# Patient Record
Sex: Male | Born: 1960 | Race: White | Hispanic: No | Marital: Single | Smoking: Current every day smoker
Health system: Southern US, Community
[De-identification: ages and names within clinical notes are randomized; demographics above are authoritative.]

## PROBLEM LIST (undated history)

## (undated) DIAGNOSIS — M549 Dorsalgia, unspecified: Secondary | ICD-10-CM

## (undated) DIAGNOSIS — G8929 Other chronic pain: Secondary | ICD-10-CM

## (undated) HISTORY — PX: CHOLECYSTECTOMY: SHX55

---

## 1998-05-25 ENCOUNTER — Emergency Department (HOSPITAL_COMMUNITY): Admission: EM | Admit: 1998-05-25 | Discharge: 1998-05-25 | Payer: Self-pay | Admitting: Emergency Medicine

## 2003-01-13 ENCOUNTER — Emergency Department (HOSPITAL_COMMUNITY): Admission: EM | Admit: 2003-01-13 | Discharge: 2003-01-13 | Payer: Self-pay | Admitting: Emergency Medicine

## 2009-08-08 ENCOUNTER — Emergency Department (HOSPITAL_COMMUNITY): Admission: EM | Admit: 2009-08-08 | Discharge: 2009-08-09 | Payer: Self-pay | Admitting: Emergency Medicine

## 2010-01-11 ENCOUNTER — Ambulatory Visit: Payer: Self-pay | Admitting: Radiology

## 2010-01-11 ENCOUNTER — Emergency Department (HOSPITAL_BASED_OUTPATIENT_CLINIC_OR_DEPARTMENT_OTHER): Admission: EM | Admit: 2010-01-11 | Discharge: 2010-01-11 | Payer: Self-pay | Admitting: Emergency Medicine

## 2010-06-29 LAB — CBC
HCT: 46.6 % (ref 39.0–52.0)
MCH: 30.9 pg (ref 26.0–34.0)
RBC: 5.1 MIL/uL (ref 4.22–5.81)
RDW: 12.6 % (ref 11.5–15.5)
WBC: 14 10*3/uL — ABNORMAL HIGH (ref 4.0–10.5)

## 2010-06-29 LAB — COMPREHENSIVE METABOLIC PANEL
ALT: 32 U/L (ref 0–53)
Calcium: 9.4 mg/dL (ref 8.4–10.5)
Creatinine, Ser: 1.2 mg/dL (ref 0.4–1.5)
GFR calc non Af Amer: >60 mL/min (ref >60)
Glucose, Bld: 93 mg/dL (ref 70–99)
Potassium: 4.2 mEq/L (ref 3.5–5.1)
Total Protein: 7.8 g/dL (ref 6.0–8.3)

## 2010-06-29 LAB — DIFFERENTIAL
Basophils Absolute: 0.2 10*3/uL — ABNORMAL HIGH (ref 0.0–0.1)
Lymphs Abs: 1.8 10*3/uL (ref 0.7–4.0)
Monocytes Relative: 5 % (ref 3–12)
Neutrophils Relative %: 80 % — ABNORMAL HIGH (ref 43–77)

## 2010-07-04 LAB — POCT I-STAT, CHEM 8
BUN: 10 mg/dL (ref 6–23)
Calcium, Ion: 1.01 mmol/L — ABNORMAL LOW (ref 1.12–1.32)
Creatinine, Ser: 1.4 mg/dL (ref 0.4–1.5)
HCT: 48 % (ref 39.0–52.0)
Potassium: 3.5 mEq/L (ref 3.5–5.1)
Sodium: 143 mEq/L (ref 135–145)

## 2011-08-19 ENCOUNTER — Emergency Department (INDEPENDENT_AMBULATORY_CARE_PROVIDER_SITE_OTHER): Payer: Self-pay

## 2011-08-19 ENCOUNTER — Encounter (HOSPITAL_BASED_OUTPATIENT_CLINIC_OR_DEPARTMENT_OTHER): Payer: Self-pay | Admitting: *Deleted

## 2011-08-19 ENCOUNTER — Emergency Department (HOSPITAL_BASED_OUTPATIENT_CLINIC_OR_DEPARTMENT_OTHER)
Admission: EM | Admit: 2011-08-19 | Discharge: 2011-08-19 | Disposition: A | Discharge status: Home / Self Care | Payer: Self-pay | Attending: Emergency Medicine | Admitting: Emergency Medicine

## 2011-08-19 DIAGNOSIS — R05 Cough: Secondary | ICD-10-CM

## 2011-08-19 DIAGNOSIS — G8929 Other chronic pain: Secondary | ICD-10-CM | POA: Insufficient documentation

## 2011-08-19 DIAGNOSIS — M542 Cervicalgia: Secondary | ICD-10-CM | POA: Insufficient documentation

## 2011-08-19 DIAGNOSIS — R6889 Other general symptoms and signs: Secondary | ICD-10-CM

## 2011-08-19 DIAGNOSIS — F17200 Nicotine dependence, unspecified, uncomplicated: Secondary | ICD-10-CM

## 2011-08-19 DIAGNOSIS — M549 Dorsalgia, unspecified: Secondary | ICD-10-CM | POA: Insufficient documentation

## 2011-08-19 DIAGNOSIS — R059 Cough, unspecified: Secondary | ICD-10-CM | POA: Insufficient documentation

## 2011-08-19 DIAGNOSIS — R209 Unspecified disturbances of skin sensation: Secondary | ICD-10-CM

## 2011-08-19 MED ORDER — PANTOPRAZOLE SODIUM 40 MG PO TBEC
DELAYED_RELEASE_TABLET | ORAL | Status: AC
  Filled 2011-08-19: qty 1

## 2011-08-19 MED ORDER — HYDROMORPHONE HCL PF 1 MG/ML IJ SOLN
1.0000 mg | Freq: Once | INTRAMUSCULAR | Status: AC
  Administered 2011-08-19: 1 mg via INTRAMUSCULAR
  Filled 2011-08-19: qty 1

## 2011-08-19 MED ORDER — HYDROMORPHONE HCL PF 2 MG/ML IJ SOLN
INTRAMUSCULAR | Status: AC
  Filled 2011-08-19: qty 1

## 2011-08-19 MED ORDER — OMEPRAZOLE 20 MG PO CPDR: 20.0000 mg | DELAYED_RELEASE_CAPSULE | Freq: Every day | ORAL | Status: AC

## 2011-08-19 MED ORDER — IBUPROFEN 800 MG PO TABS: 800.0000 mg | ORAL_TABLET | Freq: Three times a day (TID) | ORAL | Status: AC

## 2011-08-19 MED ORDER — OXYCODONE-ACETAMINOPHEN 5-325 MG PO TABS: 2.0000 | ORAL_TABLET | ORAL | Status: DC | PRN

## 2011-08-19 MED ORDER — OXYCODONE-ACETAMINOPHEN 5-325 MG PO TABS: 1.0000 | ORAL_TABLET | ORAL | Status: AC | PRN

## 2011-08-19 NOTE — ED Notes (Signed)
Pt states he has had numbness and tingling in his fingers and feet for about a year or so. For the past month has noticed increased pain in his upper back and if he turns a certain way "feels like electricity shoots through" Non prod cough.

## 2011-08-19 NOTE — Discharge Instructions (Signed)
Take percocet as needed for severe pain.  Do not drive within four hours of taking this medication (may cause drowsiness or confusion).  Take ibuprofen w/ food up to three times a day, as well.  It may benefit to take one omeprazole a day as well to prevent the ibuprofen from hurting your stomach.  Call [facility] [phone]) if you do not have a primary care doctor and would like assistance with finding one.   Your family doctor may be able to order an MRI of your neck and assist you in following up with a neurosurgeon.  There are no signs of a trapped nerve or mass in your throat on your cat scan today.
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
## 2011-08-20 NOTE — ED Provider Notes (Signed)
Medical screening examination/treatment/procedure(s) were performed by non-physician practitioner and as supervising physician I was immediately available for consultation/collaboration.   Gwyneth Sprout, MD 08/20/11 2154

## 2012-03-24 ENCOUNTER — Encounter (HOSPITAL_BASED_OUTPATIENT_CLINIC_OR_DEPARTMENT_OTHER): Payer: Self-pay | Admitting: *Deleted

## 2012-03-24 ENCOUNTER — Emergency Department (HOSPITAL_BASED_OUTPATIENT_CLINIC_OR_DEPARTMENT_OTHER)
Admission: EM | Admit: 2012-03-24 | Discharge: 2012-03-24 | Disposition: A | Discharge status: Home / Self Care | Payer: Self-pay | Attending: Emergency Medicine | Admitting: Emergency Medicine

## 2012-03-24 DIAGNOSIS — K089 Disorder of teeth and supporting structures, unspecified: Secondary | ICD-10-CM | POA: Insufficient documentation

## 2012-03-24 DIAGNOSIS — K0889 Other specified disorders of teeth and supporting structures: Secondary | ICD-10-CM

## 2012-03-24 DIAGNOSIS — F17200 Nicotine dependence, unspecified, uncomplicated: Secondary | ICD-10-CM | POA: Insufficient documentation

## 2012-03-24 DIAGNOSIS — Z79899 Other long term (current) drug therapy: Secondary | ICD-10-CM | POA: Insufficient documentation

## 2012-03-24 DIAGNOSIS — R51 Headache: Secondary | ICD-10-CM | POA: Insufficient documentation

## 2012-03-24 MED ORDER — PENICILLIN V POTASSIUM 500 MG PO TABS: 500.0000 mg | ORAL_TABLET | Freq: Three times a day (TID) | ORAL | Status: DC

## 2012-03-24 MED ORDER — PENICILLIN V POTASSIUM 250 MG PO TABS
500.0000 mg | ORAL_TABLET | Freq: Four times a day (QID) | ORAL | Status: DC
  Administered 2012-03-24: 250 mg via ORAL

## 2012-03-24 MED ORDER — PENICILLIN V POTASSIUM 250 MG PO TABS
ORAL_TABLET | ORAL | Status: AC
  Administered 2012-03-24: 250 mg via ORAL
  Filled 2012-03-24: qty 2

## 2012-03-24 MED ORDER — OXYCODONE-ACETAMINOPHEN 5-325 MG PO TABS: 2.0000 | ORAL_TABLET | ORAL | Status: DC | PRN

## 2012-03-24 MED ORDER — OXYCODONE-ACETAMINOPHEN 5-325 MG PO TABS
2.0000 | ORAL_TABLET | Freq: Once | ORAL | Status: AC
  Administered 2012-03-24: 2 via ORAL
  Filled 2012-03-24 (×2): qty 2

## 2012-03-24 NOTE — ED Notes (Signed)
Dental pain. States he has not slept in 2 days due to severe pain.

## 2012-03-24 NOTE — ED Provider Notes (Signed)
Medical screening examination/treatment/procedure(s) were performed by non-physician practitioner and as supervising physician I was immediately available for consultation/collaboration.    Nelia Shi, MD 03/24/12 [phone]

## 2012-03-24 NOTE — ED Provider Notes (Signed)
History     CSN: [id number]  Arrival date & time 03/24/12  1345   First MD Initiated Contact with Patient 03/24/12 1445      Chief Complaint  Patient presents with  . Dental Pain    (Consider location/radiation/quality/duration/timing/severity/associated sxs/prior treatment) HPI Comments: The patient is a 51 year old otherwise healthy male who presents with dental pain that started gradually 2 days ago. The dental pain is severe, constant and progressively worsening. The pain is aching and located in generalized upper and lower jaw. The pain does radiate into his head. Eating makes the pain worse. Nothing makes the pain better. The patient has not tried anything for pain. Associated symptoms include headache. Patient denies neck pain/stiffness, fever, NVD, edema, sore throat, throat swelling, wheezing, SOB, chest pain, abdominal pain.     Patient is a 51 y.o. male presenting with tooth pain.  Dental Pain   History reviewed. No pertinent past medical history.  Past Surgical History  Procedure Date  . Cholecystectomy     No family history on file.  History  Substance Use Topics  . Smoking status: Current Every Day Smoker -- 2.0 packs/day    Types: Cigarettes  . Smokeless tobacco: Not on file  . Alcohol Use: Yes      Review of Systems  HENT: Positive for dental problem.   All other systems reviewed and are negative.    Allergies  Vicodin  Home Medications   Current Outpatient Rx  Name  Route  Sig  Dispense  Refill  . OMEPRAZOLE 20 MG PO CPDR   Oral   Take 1 capsule (20 mg total) by mouth daily.   20 capsule   0     BP 156/74  Pulse 82  Temp 98.6 F (37 C) (Oral)  Resp 20  SpO2 99%  Physical Exam  Nursing note and vitals reviewed. Constitutional: He is oriented to person, place, and time. He appears well-developed and well-nourished. No distress.  HENT:  Head: Normocephalic and atraumatic.  Mouth/Throat: Oropharynx is clear and moist. No  oropharyngeal exudate.       Poor dentition with multiple missing teeth and some remaining teeth are loose.   Eyes: Conjunctivae normal and EOM are normal.  Neck: Normal range of motion. Neck supple.  Cardiovascular: Normal rate and regular rhythm.  Exam reveals no gallop and no friction rub.   No murmur heard. Pulmonary/Chest: Effort normal and breath sounds normal. He has no wheezes. He has no rales. He exhibits no tenderness.  Abdominal: Soft. He exhibits no distension.  Musculoskeletal: Normal range of motion.  Neurological: He is alert and oriented to person, place, and time.       Speech is goal-oriented. Moves limbs without ataxia.   Skin: Skin is warm and dry. He is not diaphoretic.  Psychiatric: He has a normal mood and affect. His behavior is normal.    ED Course  Procedures (including critical care time)  Labs Reviewed - No data to display No results found.   1. Pain, dental       MDM  2:59 PM Patient given Percocet and Pen VK for dental pain and infection prophylaxis. Patient has agreed to make an appointment with dentist within 24 hours. I specifically explained that he must call the dental clinic within 24 hours to be guaranteed to be seen. Patient afebrile and non toxic.         Emilia Beck, PA-C 03/24/12 1511

## 2012-03-24 NOTE — ED Notes (Signed)
Dental pain x 2 days, states pulled two teeth last week due to pain. Used to see dentist in [place], but is unable due to finances. Pt missing several teeth and has a loose tooth to right upper mouth. Pain constant since 1am, took one pill of amoxicillin that a friend gave him.

## 2012-11-11 ENCOUNTER — Encounter (HOSPITAL_BASED_OUTPATIENT_CLINIC_OR_DEPARTMENT_OTHER): Payer: Self-pay | Admitting: *Deleted

## 2012-11-11 ENCOUNTER — Emergency Department (HOSPITAL_BASED_OUTPATIENT_CLINIC_OR_DEPARTMENT_OTHER)
Admission: EM | Admit: 2012-11-11 | Discharge: 2012-11-11 | Disposition: A | Discharge status: Home / Self Care | Payer: Self-pay | Attending: Emergency Medicine | Admitting: Emergency Medicine

## 2012-11-11 DIAGNOSIS — D367 Benign neoplasm of other specified sites: Secondary | ICD-10-CM | POA: Insufficient documentation

## 2012-11-11 DIAGNOSIS — F17200 Nicotine dependence, unspecified, uncomplicated: Secondary | ICD-10-CM | POA: Insufficient documentation

## 2012-11-11 DIAGNOSIS — M542 Cervicalgia: Secondary | ICD-10-CM | POA: Insufficient documentation

## 2012-11-11 DIAGNOSIS — G8929 Other chronic pain: Secondary | ICD-10-CM | POA: Insufficient documentation

## 2012-11-11 MED ORDER — LIDOCAINE HCL 2 % IJ SOLN
INTRAMUSCULAR | Status: AC
  Filled 2012-11-11: qty 20

## 2012-11-11 MED ORDER — OXYCODONE-ACETAMINOPHEN 5-325 MG PO TABS: 2.0000 | ORAL_TABLET | ORAL | Status: DC | PRN

## 2012-11-11 NOTE — ED Notes (Signed)
ems transport from home- c/o abscess on left shoulder. Admits to drinking "a lot of beer"

## 2012-11-11 NOTE — ED Provider Notes (Signed)
Medical screening examination/treatment/procedure(s) were performed by non-physician practitioner and as supervising physician I was immediately available for consultation/collaboration.   Terell Kincy B. Bernette Mayers, MD 11/11/12 2149

## 2012-11-11 NOTE — ED Provider Notes (Signed)
CSN: [id number]     Arrival date & time 11/11/12  2027 History     None    Chief Complaint  Patient presents with  . Mass  . Numbness   (Consider location/radiation/quality/duration/timing/severity/associated sxs/prior Treatment) Patient is a 52 y.o. male presenting with abscess. The history is provided by the patient. No language interpreter was used.  Abscess Location:  Shoulder/arm Shoulder/arm abscess location:  L shoulder Abscess quality: painful, redness and warmth   Red streaking: no   Duration:  2 days Progression:  Worsening Pain details:    Quality:  Aching   Severity:  No pain   Timing:  Constant   Progression:  Worsening Relieved by:  Nothing Worsened by:  Nothing tried Ineffective treatments:  None tried   History reviewed. No pertinent past medical history. Past Surgical History  Procedure Laterality Date  . Cholecystectomy     No family history on file. History  Substance Use Topics  . Smoking status: Current Every Day Smoker -- 2.00 packs/day    Types: Cigarettes  . Smokeless tobacco: Not on file  . Alcohol Use: Yes    Review of Systems  Skin: Positive for wound.  All other systems reviewed and are negative.    Allergies  Vicodin  Home Medications   Current Outpatient Rx  Name  Route  Sig  Dispense  Refill  . EXPIRED: omeprazole (PRILOSEC) 20 MG capsule   Oral   Take 1 capsule (20 mg total) by mouth daily.   20 capsule   0   . oxyCODONE-acetaminophen (PERCOCET/ROXICET) 5-325 MG per tablet   Oral   Take 2 tablets by mouth every 4 (four) hours as needed for pain.   6 tablet   0   . penicillin v potassium (VEETID) 500 MG tablet   Oral   Take 1 tablet (500 mg total) by mouth 3 (three) times daily.   30 tablet   0    BP 124/73  Pulse 84  Temp(Src) 98 F (36.7 C) (Oral)  Resp 16  Ht 5\' 11"  (1.803 m)  Wt 175 lb (79.379 kg)  BMI 24.42 kg/m2  SpO2 96% Physical Exam  Nursing note and vitals reviewed. Constitutional: He is  oriented to person, place, and time. He appears well-developed and well-nourished.  HENT:  Head: Normocephalic and atraumatic.  Musculoskeletal: Normal range of motion.  1cm round area/abscess cyst on left shoulder  Neurological: He is alert and oriented to person, place, and time. He has normal reflexes.  Skin: Skin is warm.  Psychiatric: He has a normal mood and affect.    ED Course   INCISION AND DRAINAGE Date/Time: 11/11/2012 9:42 PM Performed by: Elson Areas Authorized by: Elson Areas Consent: Verbal consent not obtained. Risks and benefits: risks, benefits and alternatives were discussed Consent given by: patient Patient identity confirmed: verbally with patient Time out: Immediately prior to procedure a "time out" was called to verify the correct patient, procedure, equipment, support staff and site/side marked as required. Type: cyst Body area: upper extremity Local anesthetic: lidocaine 2% without epinephrine Scalpel size: 11 Incision type: single straight Complexity: simple Drainage: purulent Wound treatment: wound left open Patient tolerance: Patient tolerated the procedure well with no immediate complications. Comments: Large amount of sebaceous material and capsule removed   (including critical care time)  Labs Reviewed - No data to display No results found. 1. Cyst, dermoid, arm, left   2. Chronic neck pain     MDM  Pt also has  chronic neck pain.   Pt given number for Dr. Lajoyce Corners for evaluation.   Pt given rx for Percocet  10 tablets,    Lonia Skinner [place], [place] 11/11/12 2148

## 2016-01-13 ENCOUNTER — Emergency Department (HOSPITAL_BASED_OUTPATIENT_CLINIC_OR_DEPARTMENT_OTHER): Payer: Self-pay

## 2016-01-13 ENCOUNTER — Emergency Department (HOSPITAL_BASED_OUTPATIENT_CLINIC_OR_DEPARTMENT_OTHER)
Admission: EM | Admit: 2016-01-13 | Discharge: 2016-01-13 | Disposition: A | Discharge status: Home / Self Care | Payer: Self-pay | Attending: Emergency Medicine | Admitting: Emergency Medicine

## 2016-01-13 ENCOUNTER — Encounter (HOSPITAL_BASED_OUTPATIENT_CLINIC_OR_DEPARTMENT_OTHER): Payer: Self-pay | Admitting: Emergency Medicine

## 2016-01-13 DIAGNOSIS — M5410 Radiculopathy, site unspecified: Secondary | ICD-10-CM

## 2016-01-13 DIAGNOSIS — F17210 Nicotine dependence, cigarettes, uncomplicated: Secondary | ICD-10-CM | POA: Insufficient documentation

## 2016-01-13 DIAGNOSIS — M542 Cervicalgia: Secondary | ICD-10-CM

## 2016-01-13 DIAGNOSIS — M5412 Radiculopathy, cervical region: Secondary | ICD-10-CM | POA: Insufficient documentation

## 2016-01-13 MED ORDER — PREDNISONE 10 MG PO TABS: 20.0000 mg | ORAL_TABLET | Freq: Two times a day (BID) | ORAL | 0 refills | Status: DC

## 2016-01-13 MED ORDER — KETOROLAC TROMETHAMINE 60 MG/2ML IM SOLN
60.0000 mg | Freq: Once | INTRAMUSCULAR | Status: AC
  Administered 2016-01-13: 60 mg via INTRAMUSCULAR
  Filled 2016-01-13: qty 2

## 2016-01-13 MED ORDER — OXYCODONE-ACETAMINOPHEN 5-325 MG PO TABS: 1.0000 | ORAL_TABLET | Freq: Four times a day (QID) | ORAL | 0 refills | Status: DC | PRN

## 2016-01-13 MED ORDER — SODIUM CHLORIDE 0.9 % IV BOLUS (SEPSIS)
30.0000 mL/kg | Freq: Once | INTRAVENOUS | Status: AC
  Administered 2016-01-13: 2313 mL via INTRAVENOUS

## 2016-01-13 MED FILL — predniSONE 10 MG TABS: 10 | 5 days supply | Qty: 20 | Fill #0

## 2016-01-13 MED FILL — OXYCODONE/APAP 5/325MG: 5-325 | 2 days supply | Qty: 20 | Fill #0

## 2016-01-13 NOTE — ED Notes (Signed)
Per MD discontinue IV fluids.

## 2016-01-13 NOTE — ED Notes (Signed)
Patient to CT.

## 2016-01-13 NOTE — ED Triage Notes (Signed)
Patient reports chronic neck pain x 2 years.  Reports that this has gotten worse over the past few days.  States that the pain radiates down into his collar bone and shoulder on the left side.

## 2016-01-13 NOTE — Discharge Instructions (Addendum)
Prednisone as prescribed.  Percocet as prescribed as needed for pain.  Follow-up with a primary Dr. if not improving in the next week to discuss further imaging or referral to a surgeon.

## 2016-01-13 NOTE — ED Provider Notes (Signed)
[facility] Provider Note   CSN: [id number] Arrival date & time: 01/13/16  [id number]     History   Chief Complaint Chief Complaint  Patient presents with  . Neck Pain    HPI Jimmy Small is a 55 y.o. male.  Patient is a 55 year old male with no significant past medical history. He presents for evaluation of neck pain. He reports having problems with his neck for the past 2 years, however has significantly worsened over the past several days. 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 He states that his fourth and fifth fingers have been numb for many months. He has seen his doctor in the past, however no studies have been performed.      History reviewed. No pertinent past medical history.  There are no active problems to display for this patient.   Past Surgical History:  Procedure Laterality Date  . CHOLECYSTECTOMY         Home Medications    Prior to Admission medications   Medication Sig Start Date End Date Taking? Authorizing Provider  omeprazole (PRILOSEC) 20 MG capsule Take 1 capsule (20 mg total) by mouth daily. 08/19/11 08/18/12  Gertha Calkin, PA-C  oxyCODONE-acetaminophen (PERCOCET/ROXICET) 5-325 MG per tablet Take 2 tablets by mouth every 4 (four) hours as needed for pain. 03/24/12   Alvina Chou, PA-C  oxyCODONE-acetaminophen (PERCOCET/ROXICET) 5-325 MG per tablet Take 2 tablets by mouth every 4 (four) hours as needed for pain. 11/11/12   Fransico Meadow, PA-C  penicillin v potassium (VEETID) 500 MG tablet Take 1 tablet (500 mg total) by mouth 3 (three) times daily. 03/24/12   Alvina Chou, PA-C    Family History History reviewed. No pertinent family history.  Social History Social History  Substance Use Topics  . Smoking status: Current Every Day Smoker    Packs/day: 2.00    Types: Cigarettes  . Smokeless tobacco: Never Used  . Alcohol use Yes     Allergies   Vicodin  [hydrocodone-acetaminophen]   Review of Systems Review of Systems  All other systems reviewed and are negative.    Physical Exam Updated Vital Signs BP 158/81 (BP Location: Left Arm)   Pulse 62   Temp 98.1 F (36.7 C) (Oral)   Resp 18   Ht 6' (1.829 m)   Wt 170 lb (77.1 kg)   SpO2 98%   BMI 23.06 kg/m   Physical Exam  Constitutional: He is oriented to person, place, and time. He appears well-developed and well-nourished. No distress.  HENT:  Head: Normocephalic and atraumatic.  Pulmonary/Chest: Effort normal.  Musculoskeletal: Normal range of motion.  There is tenderness to palpation of the soft tissues of the left side of the neck. He has some discomfort with turning his head.  Neurological: He is alert and oriented to person, place, and time.  Is able to flex, extend, and oppose all fingers. Ulnar and radial pulses are easily palpable. Sensation is intact to the entire hand.  Skin: Skin is warm and dry. He is not diaphoretic.  Nursing note and vitals reviewed.    ED Treatments / Results  Labs (all labs ordered are listed, but only abnormal results are displayed) Labs Reviewed - No data to display  EKG   Radiology No results found.  Procedures Procedures (including critical care time)  Medications Ordered in ED Medications  ketorolac (TORADOL) injection 60 mg (not administered)  sodium chloride 0.9 % bolus  2,313 mL (not administered)     Initial Impression / Assessment and Plan / ED Course  I have reviewed the triage vital signs and the nursing notes.  Pertinent labs & imaging results that were available during my care of the patient were reviewed by me and considered in my medical decision making (see chart for details).  Clinical Course    Patient presents with complaints of severe left-sided neck pain that radiates into his shoulder. He also reports numbness to his left fingertips. The symptoms at the present for several years, however recently  have worsened. His x-rays show the suggestion of a clay shoveler's fracture and radiology recommended a CT scan. 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 He will be treated with prednisone, pain medication, and follow-up with his primary Dr. if not improving in the next week.  Final Clinical Impressions(s) / ED Diagnoses   Final diagnoses:  None    New Prescriptions New Prescriptions   No medications on file     Veryl Speak, MD 01/13/16 1108

## 2017-11-10 ENCOUNTER — Emergency Department (HOSPITAL_BASED_OUTPATIENT_CLINIC_OR_DEPARTMENT_OTHER)
Admission: EM | Admit: 2017-11-10 | Discharge: 2017-11-10 | Disposition: A | Discharge status: Home / Self Care | Payer: BLUE CROSS/BLUE SHIELD | Attending: Emergency Medicine | Admitting: Emergency Medicine

## 2017-11-10 ENCOUNTER — Other Ambulatory Visit: Payer: Self-pay

## 2017-11-10 ENCOUNTER — Emergency Department (HOSPITAL_BASED_OUTPATIENT_CLINIC_OR_DEPARTMENT_OTHER): Payer: BLUE CROSS/BLUE SHIELD

## 2017-11-10 ENCOUNTER — Encounter (HOSPITAL_BASED_OUTPATIENT_CLINIC_OR_DEPARTMENT_OTHER): Payer: Self-pay | Admitting: Emergency Medicine

## 2017-11-10 DIAGNOSIS — M545 Low back pain, unspecified: Secondary | ICD-10-CM

## 2017-11-10 DIAGNOSIS — F17210 Nicotine dependence, cigarettes, uncomplicated: Secondary | ICD-10-CM | POA: Insufficient documentation

## 2017-11-10 DIAGNOSIS — Z79899 Other long term (current) drug therapy: Secondary | ICD-10-CM | POA: Diagnosis not present

## 2017-11-10 DIAGNOSIS — G8929 Other chronic pain: Secondary | ICD-10-CM | POA: Diagnosis not present

## 2017-11-10 HISTORY — DX: Dorsalgia, unspecified: M54.9

## 2017-11-10 HISTORY — DX: Other chronic pain: G89.29

## 2017-11-10 MED ORDER — OXYCODONE-ACETAMINOPHEN 5-325 MG PO TABS
1.0000 | ORAL_TABLET | Freq: Once | ORAL | Status: AC
  Administered 2017-11-10: 1 via ORAL
  Filled 2017-11-10: qty 1

## 2017-11-10 MED ORDER — MELOXICAM 15 MG PO TABS: 15.0000 mg | ORAL_TABLET | Freq: Every day | ORAL | 0 refills | Status: AC

## 2017-11-10 MED ORDER — PREDNISONE 20 MG PO TABS: 40.0000 mg | ORAL_TABLET | Freq: Every day | ORAL | 0 refills | Status: AC

## 2017-11-10 MED ORDER — METHOCARBAMOL 500 MG PO TABS: 500.0000 mg | ORAL_TABLET | Freq: Two times a day (BID) | ORAL | 0 refills | Status: DC

## 2017-11-10 NOTE — ED Notes (Signed)
Patient transported to X-ray 

## 2017-11-10 NOTE — ED Triage Notes (Signed)
L low back pain that started today. Denies injury.

## 2017-11-10 NOTE — Discharge Instructions (Signed)
Please establish care with PCP and follow up with pain management.

## 2017-11-10 NOTE — ED Provider Notes (Signed)
[facility] EMERGENCY DEPARTMENT Provider Note   CSN: [id number] Arrival date & time: 11/10/17  1526     History   Chief Complaint Chief Complaint  Patient presents with  . Back Pain    HPI Jimmy Small is a 57 y.o. male.  57 y.o male smoker with a PMH of chronic back pain presents to the ED with a chief complaint of lower back pain x several weeks. Patient states he's been having this pain for several weeks but got worst 2 hours when he was standing and he felt his back give out. He describes the pain as in stabbing nature mainly on his left lower back with radiation to his left buttock, no radiation to his knee. Patient reports the pain is worse with movement and when changing positions. He tried some ibuprofen for relieve but states no improvement in symptoms. He denies any fever,urinary symptoms, bladder or bowel incontinence, no chest pain or shortness of breath.      Past Medical History:  Diagnosis Date  . Chronic back pain     There are no active problems to display for this patient.   Past Surgical History:  Procedure Laterality Date  . CHOLECYSTECTOMY          Home Medications    Prior to Admission medications   Medication Sig Start Date End Date Taking? Authorizing Provider  meloxicam (MOBIC) 15 MG tablet Take 1 tablet (15 mg total) by mouth daily for 7 days. 11/10/17 11/17/17  Janeece Fitting, PA-C  methocarbamol (ROBAXIN) 500 MG tablet Take 1 tablet (500 mg total) by mouth 2 (two) times daily. 11/10/17   Janeece Fitting, PA-C  omeprazole (PRILOSEC) 20 MG capsule Take 1 capsule (20 mg total) by mouth daily. 08/19/11 08/18/12  Schinlever, Barnetta Chapel, PA-C  predniSONE (DELTASONE) 20 MG tablet Take 2 tablets (40 mg total) by mouth daily for 5 days. 11/10/17 11/15/17  Janeece Fitting, PA-C    Family History No family history on file.  Social History Social History   Tobacco Use  . Smoking status: Current Every Day Smoker    Packs/day: 2.00    Types: Cigarettes    . Smokeless tobacco: Never Used  Substance Use Topics  . Alcohol use: Yes  . Drug use: Yes    Frequency: 1.0 times per week    Types: Marijuana    Comment: occassional     Allergies   Vicodin [hydrocodone-acetaminophen]   Review of Systems Review of Systems  Constitutional: Negative for chills and fever.  HENT: Negative for ear pain and sore throat.   Eyes: Negative for pain and visual disturbance.  Respiratory: Negative for cough and shortness of breath.   Cardiovascular: Negative for chest pain and palpitations.  Gastrointestinal: Negative for abdominal pain and vomiting.  Genitourinary: Negative for dysuria and hematuria.  Musculoskeletal: Positive for back pain and myalgias. Negative for arthralgias.  Skin: Negative for color change and rash.  Neurological: Negative for seizures, syncope, weakness, light-headedness and headaches.  All other systems reviewed and are negative.    Physical Exam Updated Vital Signs BP (!) 141/79 (BP Location: Left Arm)   Pulse 82   Temp 97.9 F (36.6 C) (Oral)   Resp 18   Ht 5\' 11"  (1.803 m)   Wt 72.6 kg (160 lb)   SpO2 98%   BMI 22.32 kg/m   Physical Exam  Constitutional: He is oriented to person, place, and time. He appears well-developed and well-nourished.  HENT:  Head: Normocephalic and atraumatic.  Mouth/Throat: Oropharynx is clear and moist.  Eyes: Pupils are equal, round, and reactive to light. No scleral icterus.  Neck: Normal range of motion. Neck supple.  Cardiovascular: Normal heart sounds.  Pulmonary/Chest: Effort normal and breath sounds normal. He has no wheezes. He exhibits no tenderness.  Abdominal: Soft. Bowel sounds are normal. He exhibits no distension. There is no tenderness.  Musculoskeletal: He exhibits tenderness. He exhibits no deformity.       Cervical back: Normal. He exhibits no bony tenderness, no swelling, no edema and no pain.       Thoracic back: Normal. He exhibits no swelling, no edema, no  pain and no spasm.       Lumbar back: He exhibits tenderness, pain and spasm. He exhibits no bony tenderness and no swelling.       Back:  Pain reproducible with palpation of his lower back, radiation to his buttock  Neurological: He is alert and oriented to person, place, and time.  Skin: Skin is warm and dry. Capillary refill takes less than 2 seconds. No erythema.  Nursing note and vitals reviewed.    ED Treatments / Results  Labs (all labs ordered are listed, but only abnormal results are displayed) Labs Reviewed - No data to display  EKG None  Radiology Dg Lumbar Spine 2-3 Views  Result Date: 11/10/2017 CLINICAL DATA:  Low back pain radiating to left buttock. No known injury. EXAM: LUMBAR SPINE - 2-3 VIEW COMPARISON:  None. FINDINGS: No fracture or subluxation. Early degenerative spurring in the upper and mid lumbar spine. SI joints are symmetric and unremarkable. IMPRESSION: No acute bony abnormality. Electronically Signed   By: Rolm Baptise M.D.   On: 11/10/2017 16:55    Procedures Procedures (including critical care time)  Medications Ordered in ED Medications  oxyCODONE-acetaminophen (PERCOCET/ROXICET) 5-325 MG per tablet 1 tablet (1 tablet Oral Given 11/10/17 1616)     Initial Impression / Assessment and Plan / ED Course  I have reviewed the triage vital signs and the nursing notes.  Pertinent labs & imaging results that were available during my care of the patient were reviewed by me and considered in my medical decision making (see chart for details).    Patient has a history of back pain and was told to establish care with pain management physician. He states he never did. This pain is a chronic complaint for him. Patient feels better after receiving percocet in the ED. His lumbar xray showed no acute fractures.Patient is having some reproducible pain with palpation along his lower lumbar region.He denies any bowel or bladder incontinence at this time I think cauda  equina is less likely. Patient is not complaining of urinary symptoms, I believe nephrolithiasis is less likely. Patient asked for percocet prescription I have advised him at this time that establishing care with pain management is warranted. He is understand and agrees with plan. I will send home with steroids, mobic and muscle relaxers to help treat this chronic pain.   Final Clinical Impressions(s) / ED Diagnoses   Final diagnoses:  Chronic left-sided low back pain without sciatica    ED Discharge Orders        Ordered    predniSONE (DELTASONE) 20 MG tablet  Daily     11/10/17 1723    meloxicam (MOBIC) 15 MG tablet  Daily     11/10/17 1723    methocarbamol (ROBAXIN) 500 MG tablet  2 times daily     11/10/17 1723  Janeece Fitting, PA-C 11/10/17 1809    Virgel Manifold, MD 11/10/17 1930

## 2019-01-20 ENCOUNTER — Emergency Department (HOSPITAL_COMMUNITY)
Admission: EM | Admit: 2019-01-20 | Discharge: 2019-01-20 | Disposition: A | Discharge status: Home / Self Care | Attending: Emergency Medicine | Admitting: Emergency Medicine

## 2019-01-20 ENCOUNTER — Emergency Department (HOSPITAL_COMMUNITY)

## 2019-01-20 ENCOUNTER — Encounter (HOSPITAL_COMMUNITY): Payer: Self-pay

## 2019-01-20 DIAGNOSIS — F17210 Nicotine dependence, cigarettes, uncomplicated: Secondary | ICD-10-CM | POA: Diagnosis not present

## 2019-01-20 DIAGNOSIS — R109 Unspecified abdominal pain: Secondary | ICD-10-CM

## 2019-01-20 DIAGNOSIS — R1011 Right upper quadrant pain: Secondary | ICD-10-CM | POA: Diagnosis not present

## 2019-01-20 LAB — LIPASE, BLOOD: Lipase: 75 U/L — ABNORMAL HIGH (ref 11–51)

## 2019-01-20 LAB — CBC WITH DIFFERENTIAL/PLATELET
Abs Immature Granulocytes: 0.02 10*3/uL (ref 0.00–0.07)
Basophils Absolute: 0.1 10*3/uL (ref 0.0–0.1)
Basophils Relative: 1 %
Eosinophils Absolute: 0.1 10*3/uL (ref 0.0–0.5)
Eosinophils Relative: 2 %
HCT: 39.6 % (ref 39.0–52.0)
Hemoglobin: 13.1 g/dL (ref 13.0–17.0)
Immature Granulocytes: 0 %
Lymphocytes Relative: 22 %
Lymphs Abs: 1.6 10*3/uL (ref 0.7–4.0)
MCH: 32.4 pg (ref 26.0–34.0)
MCHC: 33.1 g/dL (ref 30.0–36.0)
MCV: 98 fL (ref 80.0–100.0)
Monocytes Absolute: 0.9 10*3/uL (ref 0.1–1.0)
Monocytes Relative: 12 %
Neutro Abs: 4.6 10*3/uL (ref 1.7–7.7)
Neutrophils Relative %: 63 %
Platelets: 214 10*3/uL (ref 150–400)
RBC: 4.04 MIL/uL — ABNORMAL LOW (ref 4.22–5.81)
RDW: 11.9 % (ref 11.5–15.5)
WBC: 7.2 10*3/uL (ref 4.0–10.5)
nRBC: 0 % (ref 0.0–0.2)

## 2019-01-20 LAB — COMPREHENSIVE METABOLIC PANEL
ALT: 25 U/L (ref 0–44)
AST: 27 U/L (ref 15–41)
Albumin: 4.1 g/dL (ref 3.5–5.0)
Alkaline Phosphatase: 44 U/L (ref 38–126)
Anion gap: 8 (ref 5–15)
BUN: 17 mg/dL (ref 6–20)
CO2: 26 mmol/L (ref 22–32)
Calcium: 9 mg/dL (ref 8.9–10.3)
Chloride: 108 mmol/L (ref 98–111)
Creatinine, Ser: 0.94 mg/dL (ref 0.61–1.24)
GFR calc Af Amer: >60 mL/min (ref >60)
GFR calc non Af Amer: >60 mL/min (ref >60)
Glucose, Bld: 105 mg/dL — ABNORMAL HIGH (ref 70–99)
Potassium: 3.9 mmol/L (ref 3.5–5.1)
Sodium: 142 mmol/L (ref 135–145)
Total Bilirubin: 0.8 mg/dL (ref 0.3–1.2)
Total Protein: 6.9 g/dL (ref 6.5–8.1)

## 2019-01-20 LAB — URINALYSIS, ROUTINE W REFLEX MICROSCOPIC
Bilirubin Urine: NEGATIVE
Glucose, UA: NEGATIVE mg/dL
Hgb urine dipstick: NEGATIVE
Ketones, ur: NEGATIVE mg/dL
Leukocytes,Ua: NEGATIVE
Nitrite: NEGATIVE
Protein, ur: NEGATIVE mg/dL
Specific Gravity, Urine: 1.015 (ref 1.005–1.030)
pH: 6 (ref 5.0–8.0)

## 2019-01-20 MED ORDER — LIDOCAINE 5 % EX PTCH: 1.0000 | MEDICATED_PATCH | CUTANEOUS | 0 refills | Status: AC

## 2019-01-20 MED ORDER — SODIUM CHLORIDE 0.9 % IV BOLUS
1000.0000 mL | Freq: Once | INTRAVENOUS | Status: AC
  Administered 2019-01-20: 1000 mL via INTRAVENOUS

## 2019-01-20 MED ORDER — ONDANSETRON HCL 4 MG/2ML IJ SOLN
4.0000 mg | Freq: Once | INTRAMUSCULAR | Status: AC
  Administered 2019-01-20: 4 mg via INTRAVENOUS
  Filled 2019-01-20: qty 2

## 2019-01-20 MED ORDER — FENTANYL CITRATE (PF) 100 MCG/2ML IJ SOLN
50.0000 ug | Freq: Once | INTRAMUSCULAR | Status: AC
  Administered 2019-01-20: 50 ug via INTRAVENOUS
  Filled 2019-01-20: qty 2

## 2019-01-20 NOTE — ED Triage Notes (Signed)
Pt arrives from [place] with [facility] for right flank/back/leg pain. Pt has been incarcerated since 01/16/19- states that the pain is 10/10.

## 2019-01-20 NOTE — ED Provider Notes (Signed)
[facility] DEPT Provider Note   CSN: [id number] Arrival date & time: 01/20/19  1756     History   Chief Complaint Chief Complaint  Patient presents with  . Flank Pain    HPI Jimmy Small is a 58 y.o. male.     The history is provided by the patient.  Flank Pain This is a new problem. The current episode started more than 2 days ago. The problem occurs daily. Progression since onset: waxes and wanes  Associated symptoms include abdominal pain. Pertinent negatives include no chest pain, no headaches and no shortness of breath. Nothing aggravates the symptoms. Nothing relieves the symptoms. He has tried nothing for the symptoms. The treatment provided no relief.    Past Medical History:  Diagnosis Date  . Chronic back pain     There are no active problems to display for this patient.   Past Surgical History:  Procedure Laterality Date  . CHOLECYSTECTOMY          Home Medications    Prior to Admission medications   Medication Sig Start Date End Date Taking? Authorizing Provider  lidocaine (LIDODERM) 5 % Place 1 patch onto the skin daily. Remove & Discard patch within 12 hours or as directed by MD 01/20/19 02/19/19  Lennice Sites, DO  methocarbamol (ROBAXIN) 500 MG tablet Take 1 tablet (500 mg total) by mouth 2 (two) times daily. Patient not taking: Reported on 01/20/2019 11/10/17   Janeece Fitting, PA-C  omeprazole (PRILOSEC) 20 MG capsule Take 1 capsule (20 mg total) by mouth daily. Patient not taking: Reported on 01/20/2019 08/19/11 08/18/12  Schinlever, Barnetta Chapel, PA-C    Family History No family history on file.  Social History Social History   Tobacco Use  . Smoking status: Current Every Day Smoker    Packs/day: 2.00    Types: Cigarettes  . Smokeless tobacco: Never Used  Substance Use Topics  . Alcohol use: Yes  . Drug use: Yes    Frequency: 1.0 times per week    Types: Marijuana    Comment: occassional     Allergies    Vicodin [hydrocodone-acetaminophen]   Review of Systems Review of Systems  Constitutional: Negative for chills and fever.  HENT: Negative for ear pain and sore throat.   Eyes: Negative for pain and visual disturbance.  Respiratory: Negative for cough and shortness of breath.   Cardiovascular: Negative for chest pain and palpitations.  Gastrointestinal: Positive for abdominal pain. Negative for vomiting.  Genitourinary: Positive for flank pain. Negative for decreased urine volume, difficulty urinating, discharge, dysuria, enuresis, frequency, genital sores, hematuria, penile pain, penile swelling, scrotal swelling, testicular pain and urgency.  Musculoskeletal: Negative for arthralgias and back pain.  Skin: Negative for color change and rash.  Neurological: Negative for seizures, syncope and headaches.  All other systems reviewed and are negative.    Physical Exam Updated Vital Signs  ED Triage Vitals  Enc Vitals Group     BP 01/20/19 1901 125/85     Pulse Rate 01/20/19 1820 (!) 58     Resp 01/20/19 1901 17     Temp 01/20/19 1817 98 F (36.7 C)     Temp Source 01/20/19 1817 Oral     SpO2 01/20/19 1812 98 %     Weight --      Height --      Head Circumference --      Peak Flow --      Pain Score 01/20/19 1815 10  Pain Loc --      Pain Edu? --      Excl. in [place]? --     Physical Exam Vitals signs and nursing note reviewed.  Constitutional:      Appearance: He is well-developed.  HENT:     Head: Normocephalic and atraumatic.     Nose: Nose normal.     Mouth/Throat:     Mouth: Mucous membranes are moist.  Eyes:     Extraocular Movements: Extraocular movements intact.     Conjunctiva/sclera: Conjunctivae normal.     Pupils: Pupils are equal, round, and reactive to light.  Neck:     Musculoskeletal: Normal range of motion and neck supple.  Cardiovascular:     Rate and Rhythm: Normal rate and regular rhythm.     Pulses: Normal pulses.     Heart sounds: Normal  heart sounds. No murmur.  Pulmonary:     Effort: Pulmonary effort is normal. No respiratory distress.     Breath sounds: Normal breath sounds.  Abdominal:     Palpations: Abdomen is soft.     Tenderness: There is abdominal tenderness (right side abdomen ). There is right CVA tenderness.  Genitourinary:    Penis: Normal.      Scrotum/Testes: Normal.  Musculoskeletal: Normal range of motion.        General: No tenderness.  Skin:    General: Skin is warm and dry.     Capillary Refill: Capillary refill takes less than 2 seconds.  Neurological:     General: No focal deficit present.     Mental Status: He is alert.      ED Treatments / Results  Labs (all labs ordered are listed, but only abnormal results are displayed) Labs Reviewed  CBC WITH DIFFERENTIAL/PLATELET - Abnormal; Notable for the following components:      Result Value   RBC 4.04 (*)    All other components within normal limits  COMPREHENSIVE METABOLIC PANEL - Abnormal; Notable for the following components:   Glucose, Bld 105 (*)    All other components within normal limits  LIPASE, BLOOD - Abnormal; Notable for the following components:   Lipase 75 (*)    All other components within normal limits  URINALYSIS, ROUTINE W REFLEX MICROSCOPIC    EKG None  Radiology Ct Renal Stone Study  Result Date: 01/20/2019 CLINICAL DATA:  Right flank and back pain EXAM: CT ABDOMEN AND PELVIS WITHOUT CONTRAST TECHNIQUE: Multidetector CT imaging of the abdomen and pelvis was performed following the standard protocol without IV contrast. COMPARISON:  None. FINDINGS: Lower chest: The visualized heart size within normal limits. No pericardial fluid/thickening. No hiatal hernia. The visualized portions of the lungs are clear. Hepatobiliary: Although limited due to the lack of intravenous contrast, normal in appearance without gross focal abnormality. The patient is status post cholecystectomy. No biliary ductal dilation. Pancreas:   Unremarkable.  No surrounding inflammatory changes. Spleen: Normal in size. Although limited due to the lack of intravenous contrast, normal in appearance. Adrenals/Urinary Tract: Both adrenal glands appear normal. The kidneys and collecting system appear normal without evidence of urinary tract calculus or hydronephrosis. Bladder is unremarkable. Stomach/Bowel: The stomach, small bowel, and colon are normal in appearance. A moderate amount of colonic stool is present. No inflammatory changes or obstructive findings. appendix is normal. Vascular/Lymphatic: There are no enlarged abdominal or pelvic lymph nodes. Scattered aortic atherosclerotic calcifications are seen without aneurysmal dilatation. Reproductive: The prostate is unremarkable. Other: No evidence of abdominal wall mass  or hernia. Musculoskeletal: No acute or significant osseous findings. IMPRESSION: No renal, collecting system, or bladder calculi. No other acute intra-abdominal pathology to explain the patient's symptoms. Electronically Signed   By: Prudencio Pair M.D.   On: 01/20/2019 20:22    Procedures Procedures (including critical care time)  Medications Ordered in ED Medications  fentaNYL (SUBLIMAZE) injection 50 mcg (50 mcg Intravenous Given 01/20/19 1920)  sodium chloride 0.9 % bolus 1,000 mL (0 mLs Intravenous Stopped 01/20/19 2245)  ondansetron (ZOFRAN) injection 4 mg (4 mg Intravenous Given 01/20/19 1921)     Initial Impression / Assessment and Plan / ED Course  I have reviewed the triage vital signs and the nursing notes.  Pertinent labs & imaging results that were available during my care of the patient were reviewed by me and considered in my medical decision making (see chart for details).     Jimmy Small is a 58 year old male with chronic back pain who presents to the ED with right flank pain.  Patient with normal vitals.  No fever.  Pain on and off for the last several days.  Worse with movement.  Has some tenderness to  the right side of his abdomen on exam.  No midline spinal pain.  Lab work showed no significant leukocytosis, anemia, electrolyte abnormality.  Urinalysis negative for infection.  GU exam is unremarkable.  CT scan of the abdomen and pelvis did not show any appendicitis, no bowel obstruction, no colitis, no kidney stone.  Overall likely musculoskeletal pain.  Patient had improvement of pain with IV fluids, fentanyl.  Given reassurance and discharged from ED in good condition.  Recommend Tylenol, Motrin, lidocaine patch for pain.  Given return precautions.  This chart was dictated using voice recognition software.  Despite best efforts to proofread,  errors can occur which can change the documentation meaning.    Final Clinical Impressions(s) / ED Diagnoses   Final diagnoses:  Flank pain    ED Discharge Orders         Ordered    lidocaine (LIDODERM) 5 %  Every 24 hours     01/20/19 2248           Lennice Sites, DO 01/20/19 2251

## 2019-01-20 NOTE — ED Notes (Signed)
Pt was verbalized discharge instructions. Pt had no further questions at this time. NAD. 

## 2019-01-20 NOTE — ED Notes (Signed)
Pt attempting to provide urine specimen. Urinal at bedside

## 2020-10-24 ENCOUNTER — Other Ambulatory Visit (HOSPITAL_BASED_OUTPATIENT_CLINIC_OR_DEPARTMENT_OTHER): Payer: Self-pay

## 2020-10-24 ENCOUNTER — Encounter (HOSPITAL_BASED_OUTPATIENT_CLINIC_OR_DEPARTMENT_OTHER): Payer: Self-pay | Admitting: Emergency Medicine

## 2020-10-24 ENCOUNTER — Emergency Department (HOSPITAL_BASED_OUTPATIENT_CLINIC_OR_DEPARTMENT_OTHER): Payer: Self-pay

## 2020-10-24 ENCOUNTER — Other Ambulatory Visit: Payer: Self-pay

## 2020-10-24 ENCOUNTER — Emergency Department (HOSPITAL_BASED_OUTPATIENT_CLINIC_OR_DEPARTMENT_OTHER)
Admission: EM | Admit: 2020-10-24 | Discharge: 2020-10-24 | Disposition: A | Discharge status: Home / Self Care | Payer: Self-pay | Attending: Emergency Medicine | Admitting: Emergency Medicine

## 2020-10-24 DIAGNOSIS — M25512 Pain in left shoulder: Secondary | ICD-10-CM | POA: Insufficient documentation

## 2020-10-24 DIAGNOSIS — R0789 Other chest pain: Secondary | ICD-10-CM | POA: Insufficient documentation

## 2020-10-24 DIAGNOSIS — F17210 Nicotine dependence, cigarettes, uncomplicated: Secondary | ICD-10-CM | POA: Insufficient documentation

## 2020-10-24 DIAGNOSIS — R202 Paresthesia of skin: Secondary | ICD-10-CM | POA: Insufficient documentation

## 2020-10-24 DIAGNOSIS — Y92009 Unspecified place in unspecified non-institutional (private) residence as the place of occurrence of the external cause: Secondary | ICD-10-CM | POA: Insufficient documentation

## 2020-10-24 DIAGNOSIS — W010XXA Fall on same level from slipping, tripping and stumbling without subsequent striking against object, initial encounter: Secondary | ICD-10-CM | POA: Insufficient documentation

## 2020-10-24 DIAGNOSIS — W19XXXA Unspecified fall, initial encounter: Secondary | ICD-10-CM

## 2020-10-24 MED ORDER — METHOCARBAMOL 500 MG PO TABS
500.0000 mg | ORAL_TABLET | Freq: Two times a day (BID) | ORAL | 0 refills | Status: AC | PRN
  Filled 2020-10-24: qty 20, 10d supply, fill #0

## 2020-10-24 MED ORDER — OXYCODONE-ACETAMINOPHEN 5-325 MG PO TABS
1.0000 | ORAL_TABLET | Freq: Once | ORAL | Status: AC
  Administered 2020-10-24: 1 via ORAL
  Filled 2020-10-24: qty 1

## 2020-10-24 MED ORDER — IBUPROFEN 800 MG PO TABS
800.0000 mg | ORAL_TABLET | Freq: Three times a day (TID) | ORAL | 0 refills | Status: AC | PRN
  Filled 2020-10-24: qty 21, 7d supply, fill #0

## 2020-10-24 NOTE — ED Notes (Signed)
Patient ambulated to X-ray 

## 2020-10-24 NOTE — ED Notes (Signed)
ED Provider at bedside. 

## 2020-10-24 NOTE — ED Triage Notes (Signed)
Pt arrives pov with c/o fall yesterday, reports L shoulder pain, difficulty moving arm. Pt has abrasion to L shoulder, L chest wall. Pt denies otc meds pta.

## 2020-10-24 NOTE — ED Provider Notes (Signed)
[facility] EMERGENCY DEPARTMENT Provider Note   CSN: [id number] Arrival date & time: 10/24/20  0900     History Chief Complaint  Patient presents with   Jimmy Small is a 60 y.o. male.  He is here for evaluation of injuries after a fall.  He said he slipped going down a ramp yesterday at home landing on his left shoulder.  Complaining of severe left shoulder pain left upper chest pain with tingling into his left hand.  No loss of consciousness.  No head or neck pain.  Not on blood thinners.  Has tried nothing for it.  The history is provided by the patient.  Fall This is a new problem. The current episode started yesterday. The problem has not changed since onset.Associated symptoms include chest pain. Pertinent negatives include no abdominal pain, no headaches and no shortness of breath. The symptoms are aggravated by bending and twisting. Nothing relieves the symptoms. He has tried rest for the symptoms. The treatment provided no relief.      Past Medical History:  Diagnosis Date   Chronic back pain     There are no problems to display for this patient.   Past Surgical History:  Procedure Laterality Date   CHOLECYSTECTOMY         History reviewed. No pertinent family history.  Social History   Tobacco Use   Smoking status: Every Day    Packs/day: 2.00    Pack years: 0.00    Types: Cigarettes   Smokeless tobacco: Never  Substance Use Topics   Alcohol use: Yes    Comment: daily   Drug use: Not Currently    Frequency: 1.0 times per week    Types: Marijuana    Comment: occassional    Home Medications Prior to Admission medications   Medication Sig Start Date End Date Taking? Authorizing Provider  methocarbamol (ROBAXIN) 500 MG tablet Take 1 tablet (500 mg total) by mouth 2 (two) times daily. Patient not taking: Reported on 01/20/2019 11/10/17   Janeece Fitting, PA-C  omeprazole (PRILOSEC) 20 MG capsule Take 1 capsule (20 mg total) by mouth  daily. Patient not taking: Reported on 01/20/2019 08/19/11 08/18/12  Schinlever, Barnetta Chapel, PA-C    Allergies    Vicodin [hydrocodone-acetaminophen]  Review of Systems   Review of Systems  Constitutional:  Negative for fever.  HENT:  Negative for sore throat.   Eyes:  Negative for visual disturbance.  Respiratory:  Negative for shortness of breath.   Cardiovascular:  Positive for chest pain.  Gastrointestinal:  Negative for abdominal pain.  Genitourinary:  Negative for dysuria.  Musculoskeletal:  Negative for neck pain.  Skin:  Negative for rash.  Neurological:  Negative for headaches.   Physical Exam Updated Vital Signs BP (!) 144/98 (BP Location: Right Arm)   Pulse 96   Temp 97.8 F (36.6 C) (Oral)   Resp 20   Ht 5\' 11"  (1.803 m)   Wt 74.8 kg   SpO2 100%   BMI 23.01 kg/m   Physical Exam Vitals and nursing note reviewed.  Constitutional:      Appearance: Normal appearance. He is well-developed.  HENT:     Head: Normocephalic and atraumatic.  Eyes:     Conjunctiva/sclera: Conjunctivae normal.  Cardiovascular:     Rate and Rhythm: Normal rate and regular rhythm.     Heart sounds: No murmur heard. Pulmonary:     Effort: Pulmonary effort is normal. No respiratory distress.  Breath sounds: Normal breath sounds.  Abdominal:     Palpations: Abdomen is soft.     Tenderness: There is no abdominal tenderness.  Musculoskeletal:        General: Tenderness and signs of injury present. No deformity.     Cervical back: Neck supple.     Comments: He has diffuse tenderness around his left shoulder.  Normal landmarks.  Normal internal/external rotation.  Distal neurovascular intact.  Complaining of some subjective tingling in his left hand.  Radial pulse 2+.  Cap refill brisk.  Left chest wall also mildly tender.  No crepitus.  No cervical tenderness.  Other extremities full range of motion without any pain or limitations.  Skin:    General: Skin is warm and dry.  Neurological:      General: No focal deficit present.     Mental Status: He is alert.     GCS: GCS eye subscore is 4. GCS verbal subscore is 5. GCS motor subscore is 6.     Gait: Gait normal.    ED Results / Procedures / Treatments   Labs (all labs ordered are listed, but only abnormal results are displayed) Labs Reviewed - No data to display  EKG None  Radiology DG Ribs Unilateral W/Chest Left  Result Date: 10/24/2020 CLINICAL DATA:  Status post fall. EXAM: LEFT RIBS AND CHEST - 3+ VIEW COMPARISON:  08/19/2011 FINDINGS: No fracture or other bone lesions are seen involving the ribs. There is no evidence of pneumothorax or pleural effusion. Both lungs are clear. Heart size and mediastinal contours are within normal limits. IMPRESSION: Negative. Electronically Signed   By: Kerby Moors M.D.   On: 10/24/2020 09:57   DG Shoulder Left  Result Date: 10/24/2020 CLINICAL DATA:  Fall.  Shoulder pain EXAM: LEFT SHOULDER - 2+ VIEW COMPARISON:  None. FINDINGS: Glenohumeral joint is intact. No evidence of scapular fracture or humeral fracture. The acromioclavicular joint is intact. IMPRESSION: For No fracture or dislocation. Electronically Signed   By: Suzy Bouchard M.D.   On: 10/24/2020 09:57    Procedures Procedures   Medications Ordered in ED Medications  oxyCODONE-acetaminophen (PERCOCET/ROXICET) 5-325 MG per tablet 1 tablet (has no administration in time range)    ED Course  I have reviewed the triage vital signs and the nursing notes.  Pertinent labs & imaging results that were available during my care of the patient were reviewed by me and considered in my medical decision making (see chart for details).  Clinical Course as of 10/24/20 1734  Mon Oct 24, 2020  3825 X-rays ordered and interpreted by me as no acute fracture no pneumothorax.  No dislocation.  Reviewed with patient.  Will get him a sling. [MB]    Clinical Course User Index [MB] Hayden Rasmussen, MD   MDM  Rules/Calculators/A&P                         60 year old male not on thinners here for evaluation of injuries after a fall.  He is complaining of severe left shoulder pain and left upper chest axillary pain.  X-rays were ordered and interpreted by me of left shoulder and left ribs chest as no acute fractures no pneumothorax.  He will be treated symptomatically with anti-inflammatories accident.  Sling.  Recommended close follow-up with PCP.  Return instructions discussed  Final Clinical Impression(s) / ED Diagnoses Final diagnoses:  Acute pain of left shoulder  Fall, initial encounter    Rx /  DC Orders ED Discharge Orders          Ordered    ibuprofen (ADVIL) 800 MG tablet  Every 8 hours PRN        10/24/20 1007    methocarbamol (ROBAXIN) 500 MG tablet  2 times daily PRN        10/24/20 1007             Hayden Rasmussen, MD 10/24/20 1736

## 2020-10-24 NOTE — ED Notes (Signed)
Pt endorses having driver for discharge

## 2020-10-24 NOTE — Discharge Instructions (Addendum)
You are seen in the emergency department for evaluation of left shoulder pain after a fall.  You had x-rays of your left shoulder and chest and left ribs that did not show any fracture or dislocation.  Please use ice to the affected area.  Sling for comfort.  We are prescribing you some ibuprofen and a muscle relaxant.  Follow-up with your primary care doctor.  Return to the emergency department if any worsening or concerning symptoms

## 2020-10-24 NOTE — ED Notes (Signed)
Pt discharged to home. Discharge instructions have been discussed with patient and/or family members. Pt verbally acknowledges understanding d/c instructions, and endorses comprehension to checkout at registration before leaving.  °

## 2021-07-19 ENCOUNTER — Other Ambulatory Visit: Payer: Self-pay

## 2021-07-19 ENCOUNTER — Emergency Department (HOSPITAL_COMMUNITY)
Admission: EM | Admit: 2021-07-19 | Discharge: 2021-07-19 | Disposition: A | Discharge status: Home / Self Care | Attending: Physician Assistant | Admitting: Physician Assistant

## 2021-07-19 ENCOUNTER — Encounter (HOSPITAL_COMMUNITY): Payer: Self-pay

## 2021-07-19 DIAGNOSIS — Z5321 Procedure and treatment not carried out due to patient leaving prior to being seen by health care provider: Secondary | ICD-10-CM | POA: Insufficient documentation

## 2021-07-19 DIAGNOSIS — R1030 Lower abdominal pain, unspecified: Secondary | ICD-10-CM | POA: Insufficient documentation

## 2021-07-19 DIAGNOSIS — F10129 Alcohol abuse with intoxication, unspecified: Secondary | ICD-10-CM | POA: Insufficient documentation

## 2021-07-19 LAB — URINALYSIS, ROUTINE W REFLEX MICROSCOPIC
Bilirubin Urine: NEGATIVE
Glucose, UA: NEGATIVE mg/dL
Hgb urine dipstick: NEGATIVE
Ketones, ur: NEGATIVE mg/dL
Leukocytes,Ua: NEGATIVE
Nitrite: NEGATIVE
Protein, ur: NEGATIVE mg/dL
Specific Gravity, Urine: 1.002 — ABNORMAL LOW (ref 1.005–1.030)
pH: 5 (ref 5.0–8.0)

## 2021-07-19 NOTE — ED Provider Triage Note (Signed)
Emergency Medicine Provider Triage Evaluation Note ? ?Jimmy Small , a 61 y.o. male  was evaluated in triage.  Pt complains of urinary symptoms that have been ongoing for several months.  States "when I take a leak it hurts ".  Patient appears intoxicated at baseline. ? ?Review of Systems  ?Positive: Urinary symptoms ?Negative: Abdominal pain, fever, nausea, vomiting ? ?Physical Exam  ?BP (!) 146/76 (BP Location: Right Arm)   Pulse 91   Temp 98 ?F (36.7 ?C) (Oral)   Resp 17   SpO2 96%  ?Gen:   Awake, no distress   ?Resp:  Normal effort  ?MSK:   Moves extremities without difficulty  ?Other:  Appears clinically intoxicated slurring his words. ? ?Medical Decision Making  ?Medically screening exam initiated at 2:02 PM.  Appropriate orders placed.  Jimmy Small was informed that the remainder of the evaluation will be completed by another provider, this initial triage assessment does not replace that evaluation, and the importance of remaining in the ED until their evaluation is complete. ? ? ?  ?Janeece Fitting, PA-C ?07/19/21 1405 ? ?

## 2021-07-19 NOTE — ED Notes (Signed)
Called pt, no response.  ?

## 2021-07-19 NOTE — ED Notes (Signed)
Called pt 3x, no response.  ?

## 2021-07-19 NOTE — ED Triage Notes (Signed)
Pt arrived POV stating he is having groin pain. Pt will not say much else to RN.  ?

## 2022-01-25 IMAGING — CR DG RIBS W/ CHEST 3+V*L*
3 series · 3 of 3 positions shown · non-contrast
Comparison: 08/19/2011

CLINICAL DATA: Status post fall.

EXAM:
LEFT RIBS AND CHEST - 3+ VIEW

[w chest pa]
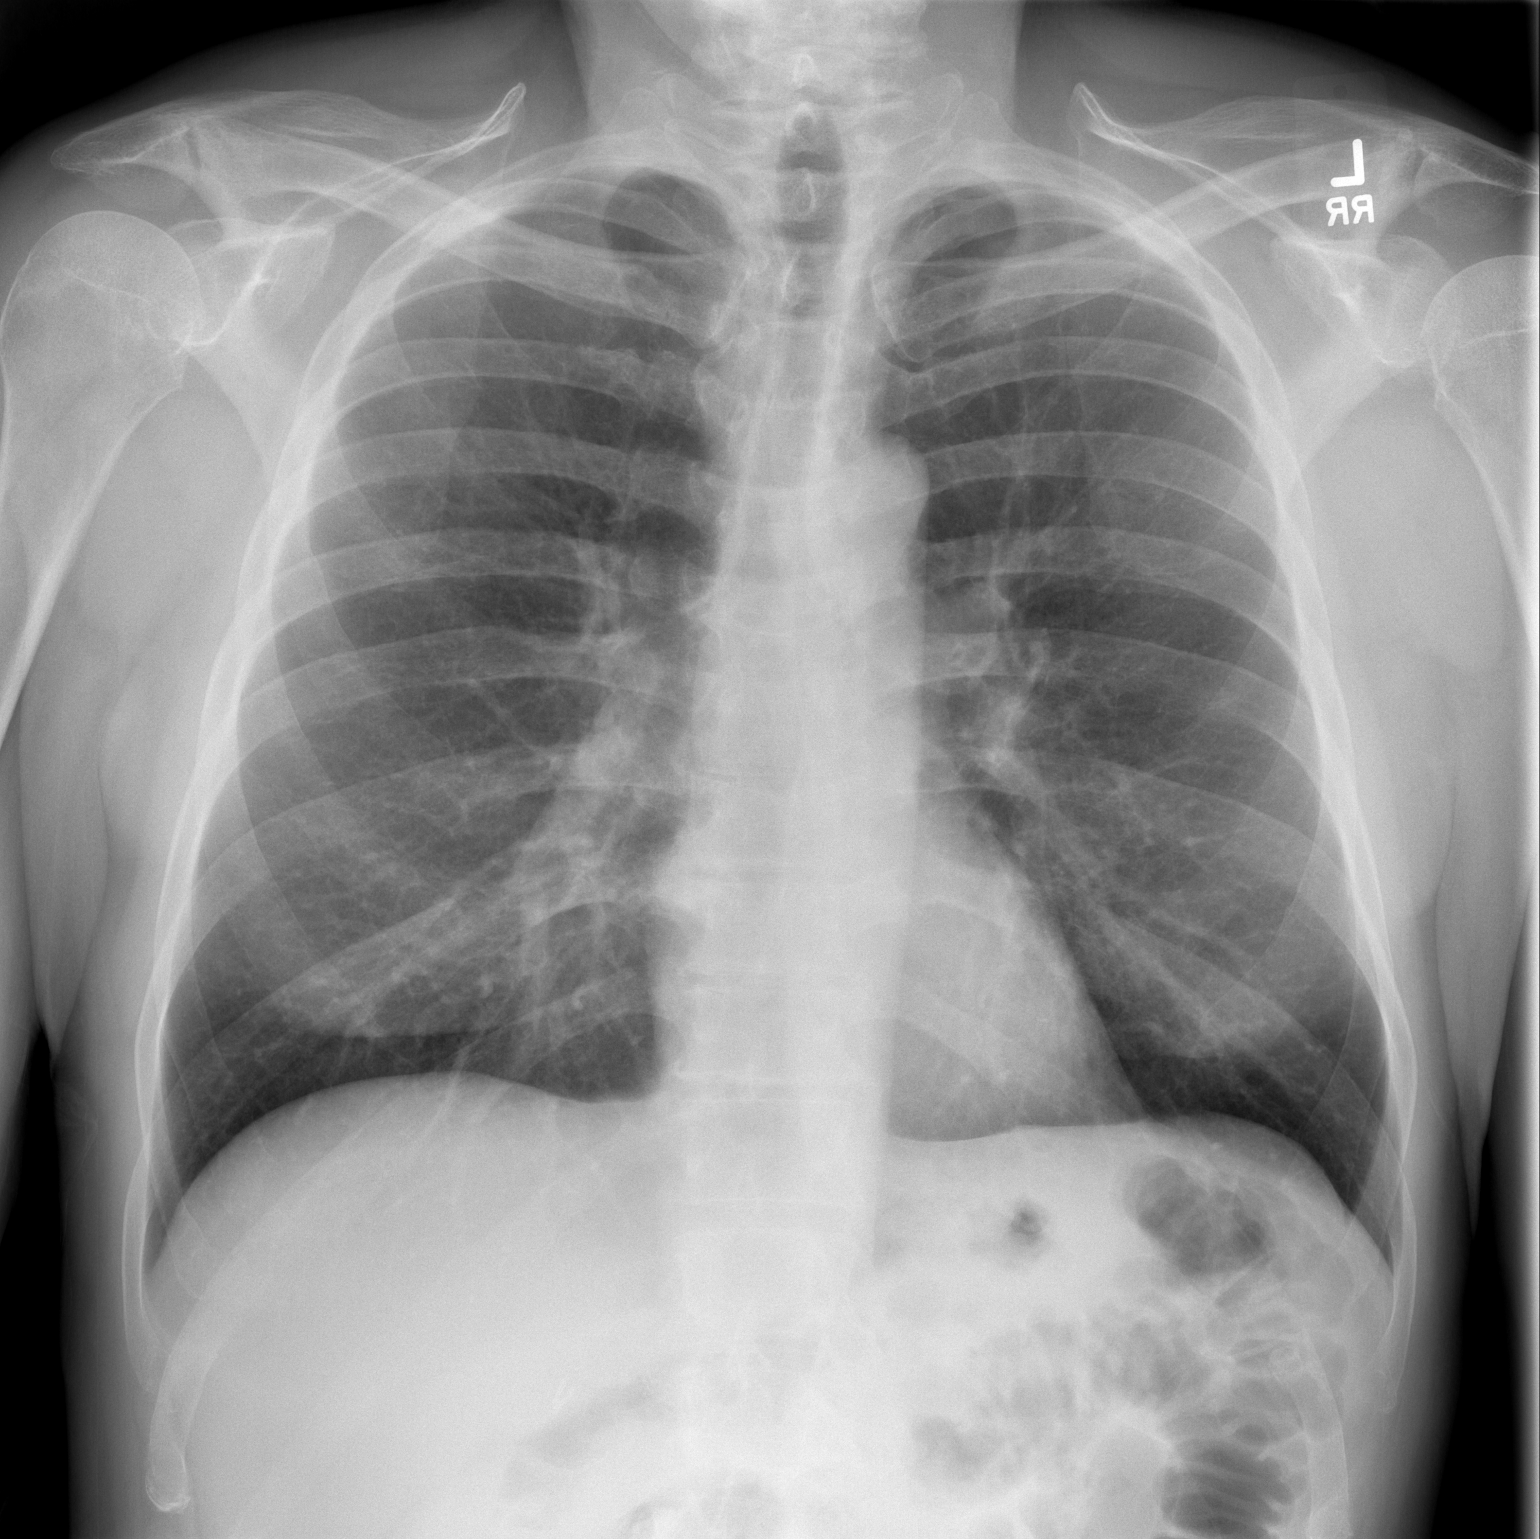

[w ribs ap/pa upper left]
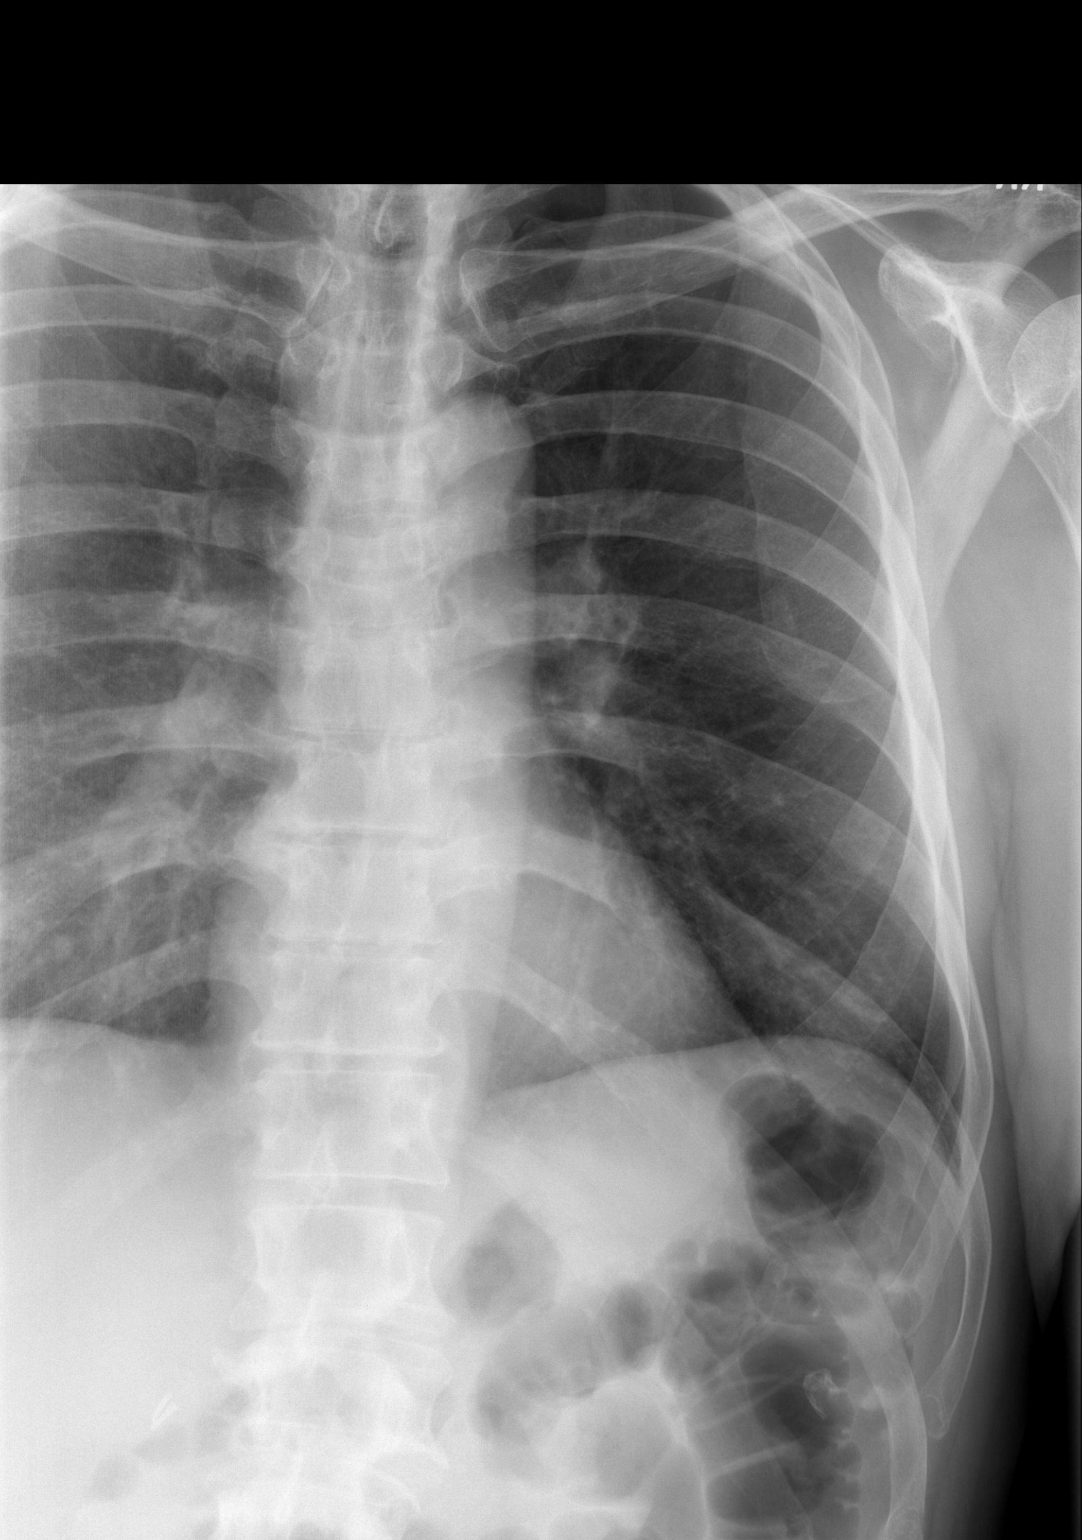

[w ribs oblique left]
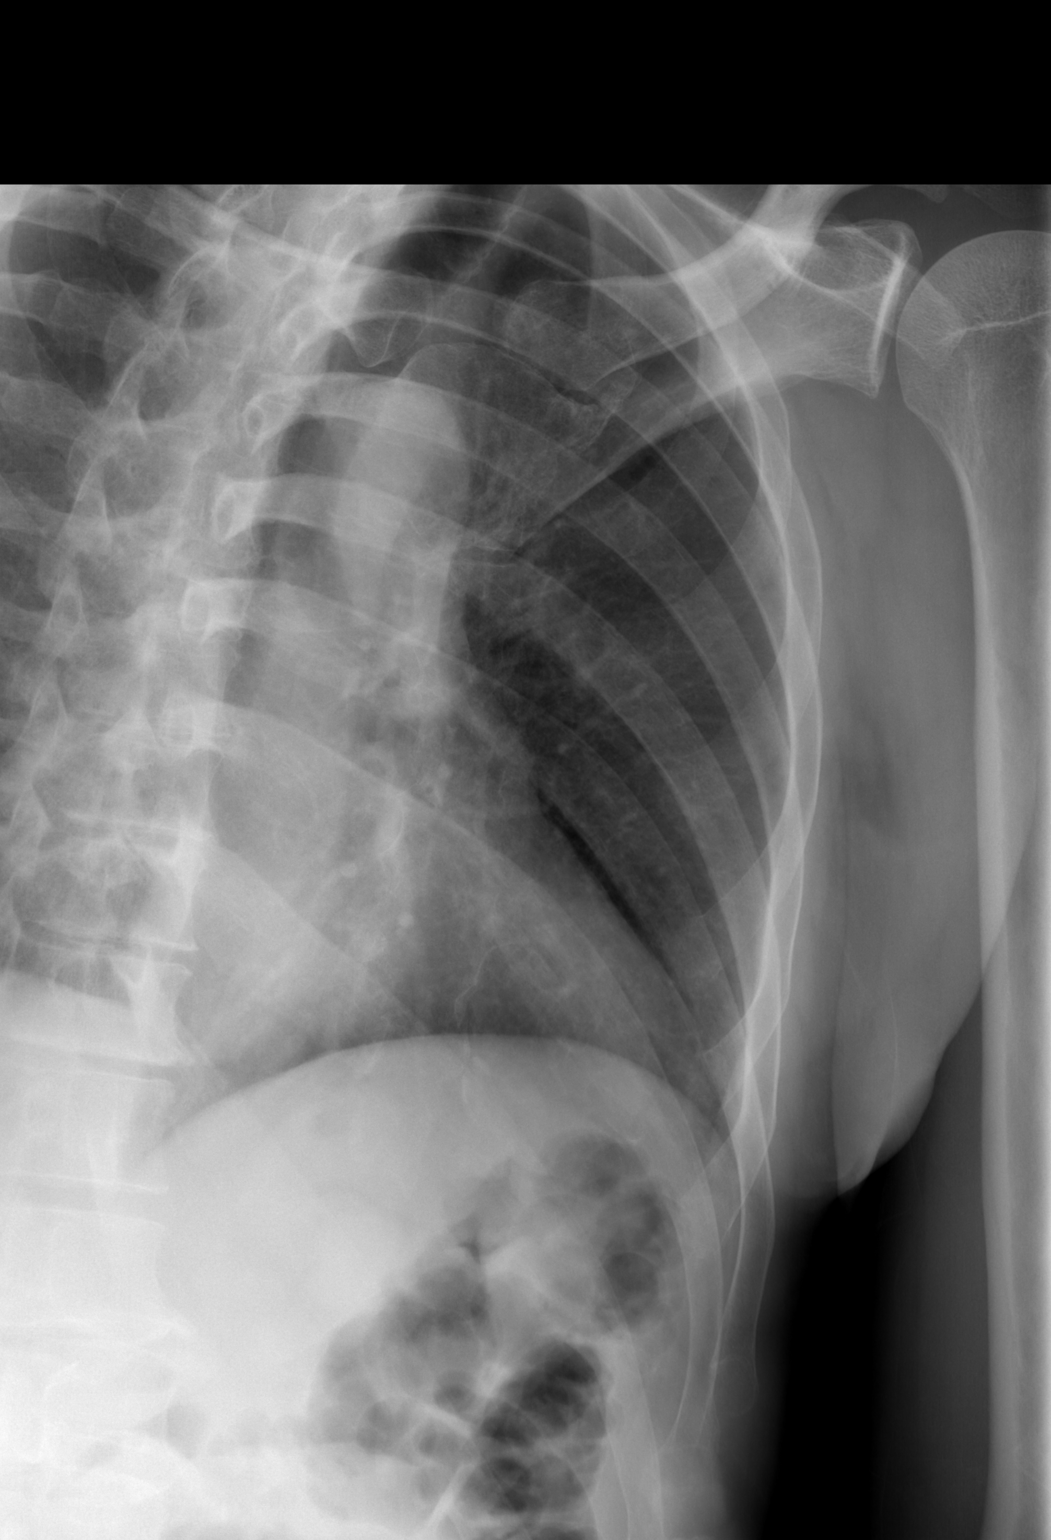

[3 of 3 positions shown; findings below may reference images not displayed]

FINDINGS: No fracture or other bone lesions are seen involving the ribs. There
is no evidence of pneumothorax or pleural effusion. Both lungs are
clear. Heart size and mediastinal contours are within normal limits.
IMPRESSION: Negative.

## 2022-11-26 ENCOUNTER — Encounter (HOSPITAL_BASED_OUTPATIENT_CLINIC_OR_DEPARTMENT_OTHER): Payer: Self-pay

## 2022-11-26 ENCOUNTER — Emergency Department (HOSPITAL_BASED_OUTPATIENT_CLINIC_OR_DEPARTMENT_OTHER): Payer: Self-pay

## 2022-11-26 ENCOUNTER — Other Ambulatory Visit: Payer: Self-pay

## 2022-11-26 ENCOUNTER — Other Ambulatory Visit (HOSPITAL_BASED_OUTPATIENT_CLINIC_OR_DEPARTMENT_OTHER): Payer: Self-pay

## 2022-11-26 ENCOUNTER — Emergency Department (HOSPITAL_BASED_OUTPATIENT_CLINIC_OR_DEPARTMENT_OTHER)
Admission: EM | Admit: 2022-11-26 | Discharge: 2022-11-26 | Disposition: A | Discharge status: Home / Self Care | Payer: Self-pay

## 2022-11-26 DIAGNOSIS — F1010 Alcohol abuse, uncomplicated: Secondary | ICD-10-CM | POA: Insufficient documentation

## 2022-11-26 DIAGNOSIS — R1030 Lower abdominal pain, unspecified: Secondary | ICD-10-CM | POA: Insufficient documentation

## 2022-11-26 LAB — COMPREHENSIVE METABOLIC PANEL
ALT: 39 U/L (ref 0–44)
AST: 40 U/L (ref 15–41)
Albumin: 4.1 g/dL (ref 3.5–5.0)
Alkaline Phosphatase: 63 U/L (ref 38–126)
Anion gap: 7 (ref 5–15)
BUN: 8 mg/dL (ref 6–20)
CO2: 31 mmol/L (ref 22–32)
Calcium: 9.1 mg/dL (ref 8.9–10.3)
Chloride: 100 mmol/L (ref 98–111)
Creatinine, Ser: 0.87 mg/dL (ref 0.61–1.24)
GFR, Estimated: >60 mL/min (ref >60)
Glucose, Bld: 106 mg/dL — ABNORMAL HIGH (ref 70–99)
Potassium: 4.5 mmol/L (ref 3.5–5.1)
Sodium: 138 mmol/L (ref 135–145)
Total Bilirubin: 0.2 mg/dL — ABNORMAL LOW (ref 0.3–1.2)
Total Protein: 7.4 g/dL (ref 6.5–8.1)

## 2022-11-26 LAB — URINALYSIS, W/ REFLEX TO CULTURE (INFECTION SUSPECTED)
Bilirubin Urine: NEGATIVE
Glucose, UA: NEGATIVE mg/dL
Ketones, ur: NEGATIVE mg/dL
Leukocytes,Ua: NEGATIVE
Nitrite: NEGATIVE
Protein, ur: NEGATIVE mg/dL
Specific Gravity, Urine: 1.01 (ref 1.005–1.030)
pH: 5.5 (ref 5.0–8.0)

## 2022-11-26 LAB — CBC
HCT: 41.5 % (ref 39.0–52.0)
Hemoglobin: 14.5 g/dL (ref 13.0–17.0)
MCH: 32.2 pg (ref 26.0–34.0)
MCHC: 34.9 g/dL (ref 30.0–36.0)
MCV: 92 fL (ref 80.0–100.0)
Platelets: 194 10*3/uL (ref 150–400)
RBC: 4.51 MIL/uL (ref 4.22–5.81)
RDW: 12.6 % (ref 11.5–15.5)
WBC: 5.3 10*3/uL (ref 4.0–10.5)
nRBC: 0 % (ref 0.0–0.2)

## 2022-11-26 LAB — LIPASE, BLOOD: Lipase: 54 U/L — ABNORMAL HIGH (ref 11–51)

## 2022-11-26 MED ORDER — IOHEXOL 300 MG/ML  SOLN
100.0000 mL | Freq: Once | INTRAMUSCULAR | Status: AC | PRN
  Administered 2022-11-26: 100 mL via INTRAVENOUS

## 2022-11-26 MED ORDER — ONDANSETRON HCL 4 MG PO TABS
4.0000 mg | ORAL_TABLET | Freq: Four times a day (QID) | ORAL | 0 refills | Status: AC
  Filled 2022-11-26: qty 12, 3d supply, fill #0

## 2022-11-26 MED ORDER — CHLORDIAZEPOXIDE HCL 25 MG PO CAPS
25.0000 mg | ORAL_CAPSULE | Freq: Once | ORAL | Status: AC
  Administered 2022-11-26: 25 mg via ORAL
  Filled 2022-11-26: qty 1

## 2022-11-26 MED ORDER — CHLORDIAZEPOXIDE HCL 25 MG PO CAPS
ORAL_CAPSULE | ORAL | 0 refills | Status: AC
  Filled 2022-11-26: qty 10, 2d supply, fill #0

## 2022-11-26 MED ORDER — ONDANSETRON HCL 4 MG/2ML IJ SOLN
4.0000 mg | Freq: Once | INTRAMUSCULAR | Status: AC
  Administered 2022-11-26: 4 mg via INTRAVENOUS
  Filled 2022-11-26: qty 2

## 2022-11-26 NOTE — ED Notes (Signed)
Last ETOH yesterday

## 2022-11-26 NOTE — Discharge Instructions (Addendum)
Please follow-up with your primary doctor soon as possible.  Return to the emergency department medially for fevers, chills, worsening abdominal pain.  Inability eat or drink, or he develop any new or worsening symptoms that are concerning to you.

## 2022-11-26 NOTE — ED Provider Notes (Signed)
[facility] Provider Note   CSN: [id number] Arrival date & time: 11/26/22  3244     History  Chief Complaint  Patient presents with   Groin Pain    Jimmy Small is a 62 y.o. male.  Presented to the emergency department with lump in groin x 1 week.  Patient is an alcoholic, daily drinker reports feeling nauseated most days for the past several months, but no vomiting.  For the past week he has noted a lump in his right groin.  He notes some intermittent dysuria.  He denies fevers, chills, chest pain.  Last bowel movement was yesterday.   Groin Pain       Home Medications Prior to Admission medications   Medication Sig Start Date End Date Taking? Authorizing Provider  chlordiazePOXIDE (LIBRIUM) 25 MG capsule Take 2 capsules (50 mg total) by mouth 3 (three) times daily for 1 day, THEN 1-2 capsules (25-50 mg total) 2 (two) times daily for 1 day, THEN 1-2 capsules (25-50 mg total) daily for 1 day. 11/26/22 11/28/22 Yes Ebon Ketchum J, DO  ondansetron (ZOFRAN) 4 MG tablet Take 1 tablet (4 mg total) by mouth every 6 (six) hours. 11/26/22  Yes Coral Spikes, DO  ibuprofen (ADVIL) 800 MG tablet Take 1 tablet (800 mg total) by mouth every 8 (eight) hours as needed for moderate pain. 10/24/20   Terrilee Files, MD  methocarbamol (ROBAXIN) 500 MG tablet Take 1 tablet (500 mg total) by mouth 2 (two) times daily as needed for muscle spasms. 10/24/20   Terrilee Files, MD  omeprazole (PRILOSEC) 20 MG capsule Take 1 capsule (20 mg total) by mouth daily. Patient not taking: Reported on 01/20/2019 08/19/11 08/18/12  Schinlever, Santina Evans, PA-C      Allergies    Vicodin [hydrocodone-acetaminophen]    Review of Systems   Review of Systems  Physical Exam Updated Vital Signs BP (!) 149/74 (BP Location: Left Arm)   Pulse 63   Temp 98 F (36.7 C) (Oral)   Resp 20   Ht 5\' 11"  (1.803 m)   Wt 68 kg   SpO2 98%   BMI 20.92 kg/m  Physical  Exam Vitals reviewed.  Constitutional:      General: He is not in acute distress.    Appearance: He is not toxic-appearing.  HENT:     Head: Normocephalic.     Nose: Nose normal.     Mouth/Throat:     Mouth: Mucous membranes are moist.  Eyes:     Conjunctiva/sclera: Conjunctivae normal.  Cardiovascular:     Rate and Rhythm: Normal rate and regular rhythm.  Pulmonary:     Effort: Pulmonary effort is normal.  Abdominal:     General: Abdomen is flat. There is no distension.     Palpations: There is no mass.     Tenderness: There is abdominal tenderness (mild RLQ). There is no guarding or rebound.  Genitourinary:    Comments: Right inguinal lymph node some lymphadenopathy Skin:    General: Skin is warm and dry.     Capillary Refill: Capillary refill takes less than 2 seconds.  Neurological:     Mental Status: He is alert and oriented to person, place, and time.  Psychiatric:        Mood and Affect: Mood normal.        Behavior: Behavior normal.     ED Results / Procedures / Treatments   Labs (all labs ordered  are listed, but only abnormal results are displayed) Labs Reviewed  URINALYSIS, W/ REFLEX TO CULTURE (INFECTION SUSPECTED) - Abnormal; Notable for the following components:      Result Value   Hgb urine dipstick TRACE (*)    Bacteria, UA RARE (*)    All other components within normal limits  COMPREHENSIVE METABOLIC PANEL - Abnormal; Notable for the following components:   Glucose, Bld 106 (*)    Total Bilirubin 0.2 (*)    All other components within normal limits  LIPASE, BLOOD - Abnormal; Notable for the following components:   Lipase 54 (*)    All other components within normal limits  CBC    EKG None  Radiology CT ABDOMEN PELVIS W CONTRAST  Result Date: 11/26/2022 CLINICAL DATA:  Pain right lower quadrant of abdomen EXAM: CT ABDOMEN AND PELVIS WITH CONTRAST TECHNIQUE: Multidetector CT imaging of the abdomen and pelvis was performed using the standard  protocol following bolus administration of intravenous contrast. RADIATION DOSE REDUCTION: This exam was performed according to the departmental dose-optimization program which includes automated exposure control, adjustment of the mA and/or kV according to patient size and/or use of iterative reconstruction technique. CONTRAST:  OMNIPAQUE IOHEXOL 300 MG/ML  SOLN COMPARISON:  01/20/2019 FINDINGS: Lower chest: Visualized lower lung fields are clear. Hepatobiliary: There is fatty infiltration in liver. There is 1.9 cm hypervascular structure in the inferior aspect of right lobe of liver suggesting possible hemangioma. There was a low-density lesion in the same region in the previous noncontrast study. There is no dilation of bile ducts. Surgical clips are seen in gallbladder fossa. Pancreas: No focal abnormalities are seen. Spleen: Unremarkable. Adrenals/Urinary Tract: Adrenals are unremarkable. There is no hydronephrosis. Right kidney is lower than usual in position. There is 1.7 cm cyst in the anterior lower pole of right kidney. There are no renal or ureteral stones. Urinary bladder is unremarkable. Stomach/Bowel: Stomach is not distended. Small bowel loops are not dilated. Appendix is not dilated. There is no significant wall thickening in colon. There is no pericolic stranding. Vascular/Lymphatic: Scattered arterial calcifications are seen. Reproductive: Unremarkable. Other: There is no ascites or pneumoperitoneum. Small umbilical hernia containing fat is seen. Small right inguinal hernia containing fat is seen. There are a few slightly enlarged lymph nodes in both inguinal regions largest measuring 9 mm in short axis in the right inguinal region. Musculoskeletal: Degenerative changes are noted in lumbar spine. IMPRESSION: There is no evidence of intestinal obstruction or pneumoperitoneum. Appendix is not dilated. There is no hydronephrosis. 1.7 cm cyst is seen in the right kidney. 1.9 cm hemangioma is seen  in the right lobe of liver. Aortic arteriosclerosis. Lumbar spondylosis. Electronically Signed   By: Ernie Avena M.D.   On: 11/26/2022 10:51    Procedures Procedures    Medications Ordered in ED Medications  ondansetron (ZOFRAN) injection 4 mg (4 mg Intravenous Given 11/26/22 0759)  chlordiazePOXIDE (LIBRIUM) capsule 25 mg (25 mg Oral Given 11/26/22 0808)  iohexol (OMNIPAQUE) 300 MG/ML solution 100 mL (100 mLs Intravenous Contrast Given 11/26/22 1000)    ED Course/ Medical Decision Making/ A&P Clinical Course as of 11/26/22 1506  Mon Nov 26, 2022  1100 CT ABDOMEN PELVIS W CONTRAST IMPRESSION: There is no evidence of intestinal obstruction or pneumoperitoneum. Appendix is not dilated. There is no hydronephrosis.  1.7 cm cyst is seen in the right kidney. 1.9 cm hemangioma is seen in the right lobe of liver. Aortic arteriosclerosis. Lumbar spondylosis.   [TY]  Clinical Course User Index [TY] Coral Spikes, DO                                 Medical Decision Making This is a 62 year old male presenting emergency department for lump in his right groin for the past week.  He is afebrile nontachycardic hemodynamically stable.  Physical exam not consistent with strangulated or incarcerated hernia.  He does have some lymphadenopathy in his right inguinal region.  Some very mild right lower quadrant tenderness.  His other symptoms seemingly are more subacute/chronic and likely related to his alcohol use.  He has no fever, tachycardia or leukocytosis to suggest systemic infection.  UA with some microscopic hematuria, but no evidence of infection despite his complaint of dysuria.  Declined STI testing.  He does have heavy alcohol use, therefore getting CMP and lipase.  Patient given Librium and this appears he is going to do some mild withdrawal as he has not had anything to drink in 24 hours. CT scan without acute pathology. With negative labs and workukp, will discharge  Amount  and/or Complexity of Data Reviewed Labs: ordered. Radiology: ordered. Decision-making details documented in ED Course.  Risk Prescription drug management.           Final Clinical Impression(s) / ED Diagnoses Final diagnoses:  Alcohol abuse  Lower abdominal pain    Rx / DC Orders ED Discharge Orders          Ordered    chlordiazePOXIDE (LIBRIUM) 25 MG capsule  Multiple Frequencies        11/26/22 1108    ondansetron (ZOFRAN) 4 MG tablet  Every 6 hours        11/26/22 1108              Coral Spikes, DO 11/26/22 1506

## 2022-11-26 NOTE — ED Triage Notes (Signed)
Pt states that he has had pain with urination x 2 weeks and has had a "knot" on right side of groin area x 1 week with pain.

## 2022-11-26 NOTE — ED Notes (Signed)
Delay in labs and CT. Previous tube reportedly hemolyzed. Green top tube redrawn and sent to lab, unable to use sunquest, computer was rebooting. CT waiting on creatinine.

## 2022-11-26 NOTE — ED Notes (Addendum)
To CT via w/c. Alert, NAD, calm, steady gait.

## 2022-11-26 NOTE — ED Notes (Signed)
Currently lying down. Reports pain and nausea improved. Remains fidgety, tremulous, restless. Updated on wait for CT. Pending creatinine.

## 2022-11-27 ENCOUNTER — Other Ambulatory Visit: Payer: Self-pay

## 2022-12-10 ENCOUNTER — Other Ambulatory Visit (HOSPITAL_BASED_OUTPATIENT_CLINIC_OR_DEPARTMENT_OTHER): Payer: Self-pay

## 2023-10-12 NOTE — Consults (Signed)
 [facility]  Consultation  Consults   Assessment   Perianal abscess  Recommendations  Incision and drainage.  I have recommended incision and drainage.  We discussed the alternative of ongoing observation.  Patient wishes to proceed.  The procedure was discussed in detail with the patient.  Questions were answered.  For further details of the procedure please see below.  Patient tolerated the procedure well without any immediate complications.  He is instructed to advance and remove the wick gauze in 24 to 48 hours.  He is also instructed to begin warm water tub soaks at least 3-4 times daily.  Follow-up in our office in 1 week for recheck.  History  History of Present Illness:  Jimmy Small is a 63 y.o. male who presents with rectal pain  63 year old gentleman otherwise healthy presents for further evaluation of perianal and rectal pain x 2 days.  He describes it as a constant throbbing pain.  It has gotten progressively worse to the point where he presented for further workup.  He denies any fevers or chills but does report some sweats.  He has had some difficulties urinating but is otherwise voiding without any difficulties.  His bowels move on a regular basis.  Last bowel movement was yesterday.  He reports this is the first time he has ever had anything like this.  Workup in the ED revealed a slightly elevated white blood cell count 11.7.  CT scan of the pelvis with IV contrast showed a left posterior perianal abscess.  Past Medical History:  Diagnosis Date   Alcohol abuse    Gallbladder disease    Neck pain    Past Surgical History:  Procedure Laterality Date   Cholecystectomy  09/2011   Tonsillectomy      Allergies[1] Infusions:  Medications:  PRN medications:  Prior to Admission medications   Medication Sig Start Date End Date Taking? Authorizing Provider  oxyCODONE  HCl (ROXICODONE ) 5 mg immediate release tablet Take one tablet (5 mg  dose) by mouth every 6 (six) hours as needed for up to 6 doses. Max Daily Amount: 20 mg 10/12/23 10/19/23  Therisa KANDICE Silvan, MD    Social History   Socioeconomic History   Marital status: Single  Tobacco Use   Smoking status: Every Day    Current packs/day: 3.00    Types: Cigarettes   Smokeless tobacco: Never  Substance and Sexual Activity   Alcohol use: Yes    Alcohol/week: 64.0 standard drinks of alcohol    Types: 64 Cans of beer per week    Comment: daily   Drug use: Yes    Frequency: 2.0 times per week    Types: Marijuana, Methamphetamines   Family History  Problem Relation Age of Onset   Diabetes Maternal Uncle    Review of Systems  Constitutional:  Positive for diaphoresis. Negative for chills and fever.  Respiratory: Negative.    Cardiovascular: Negative.   Gastrointestinal:  Positive for rectal pain. Negative for abdominal pain, anal bleeding, blood in stool, constipation, diarrhea, nausea and vomiting.  Genitourinary:  Positive for difficulty urinating. Negative for dysuria, hematuria, scrotal swelling and testicular pain.    Physical Examination  Temp:  [98.3 F (36.8 C)-98.5 F (36.9 C)] 98.3 F (36.8 C) Heart Rate:  [76-95] 76 Resp:  [18-20] 18 BP: (137-138)/(77-89) 138/89 SpO2:  [97 %] 97 %  Pain Score: 10-Worst pain ever Adult Faces: 4 O2 Device: None (Room air)    No intake or output  data in the 24 hours ending 10/12/23 0700  Physical Exam Constitutional:      General: He is not in acute distress.    Appearance: Normal appearance. He is not ill-appearing or toxic-appearing.  HENT:     Head: Normocephalic and atraumatic.  Cardiovascular:     Rate and Rhythm: Normal rate and regular rhythm.  Pulmonary:     Effort: No respiratory distress.  Genitourinary:    Comments: Tender, fluctuant fluid collection left posterior anal margin.  Digital rectal good resting tone.  Negative for mass.  Minimal tenderness noted. Neurological:     General: No  focal deficit present.     Mental Status: He is alert and oriented to person, place, and time.  Psychiatric:        Mood and Affect: Mood normal.        Behavior: Behavior normal.        Thought Content: Thought content normal.    Procedure: After informed consent was obtained and pre-procedural pause carried out, the area was prepped with Hibaclens. Lidocaine  1% without epinephrine was infiltrated for local anesthesia. A 2 cm stab incision was made. There was immediate purulent return.  The wound was probed with a hemostat and loculations broken up.  A wick gauze was placed to further facilitate drainage. Patient instructed to advance the wick daily until completely removed. He tolerated the procedure well.  Results  Labs:  Recent Results (from the past 24 hours)  CBC And Differential   Collection Time: 10/12/23  8:47 AM  Result Value Ref Range   WBC 11.7 (H) 4.0 - 10.5 thou/mcL   RBC 4.16 (L) 4.63 - 6.08 million/mcL   HGB 13.5 (L) 13.7 - 17.5 gm/dL   HCT 60.8 (L) 59.8 - 48.9 %   MCV 94.0 (H) 79.0 - 92.2 fL   MCH 32.5 (H) 25.7 - 32.2 pg   MCHC 34.5 32.3 - 36.5 gm/dL   Plt Ct 768 849 - 599 thou/mcL   RDW SD 44.1 35.1 - 46.3 fL   MPV 10.6 9.4 - 12.4 fL   NRBC% 0.0 0.0 - 0.2 /100WBC   Absolute NRBC Count 0.00 0.00 - 0.01 thou/mcL   NEUTROPHIL % 72.2 %   LYMPHOCYTE % 13.6 %   MONOCYTE % 10.8 %   Eosinophil % 2.1 %   BASOPHIL % 0.7 %   IG% 0.6 %   ABSOLUTE NEUTROPHIL COUNT 8.47 (H) 1.50 - 7.50 thou/mcL   ABSOLUTE LYMPHOCYTE COUNT 1.59 1.00 - 4.50 thou/mcL   Absolute Monocyte Count 1.27 (H) 0.10 - 0.80 thou/mcL   Absolute Eosinophil Count 0.25 0.00 - 0.50 thou/mcL   Absolute Basophil Count 0.08 0.00 - 0.20 thou/mcL   Absolute Immature Granulocyte Count 0.07 (H) 0.00 - 0.03 thou/mcL   Comprehensive Metabolic Panel   Collection Time: 10/12/23  8:47 AM  Result Value Ref Range   Na 135 (L) 136 - 146 mmol/L   Potassium 4.2 3.7 - 5.4 mmol/L   Cl 100 97 - 108 mmol/L   CO2 23 20 - 32  mmol/L   AGAP 12 7 - 16 mmol/L   Glucose 100 (H) 65 - 99 mg/dL   BUN 14 8 - 27 mg/dL   Creatinine 9.19 9.23 - 1.27 mg/dL   Ca 8.5 (L) 8.6 - 89.7 mg/dL   ALK PHOS 96 25 - 839 U/L   T Bili 0.2 0.0 - 1.2 mg/dL   Total Protein 7.0 6.0 - 8.5 gm/dL   Alb 4.0 3.6 -  4.8 gm/dL   GLOBULIN 3.0 1.5 - 4.5 gm/dL   ALBUMIN/GLOBULIN RATIO 1.3 1.1 - 2.5   BUN/CREAT RATIO 17.5 11.0 - 26.0   ALT 18 0 - 55 U/L   AST 24 0 - 40 U/L   eGFR 101 >=60 mL/min/1.11m2   Imaging: CT Pelvis W IV Contrast Result Date: 10/12/2023 CT PELVIS WITH INTRAVENOUS CONTRAST INDICATION: Other perianal abscess COMPARISON:  CT abdomen and pelvis October 16, 2019.  TECHNIQUE:  CT imaging of the pelvis was performed following the uncomplicated intravenous administration of 75 mL of Isovue-370 iodinated contrast. Dose reduction was utilized (automated exposure control, mA or kV adjustment based on patient size, or iterative image reconstruction). Coronal and sagittal reformatted images were generated and reviewed. FINDINGS: There is a peripherally enhancing fluid collection along the posterior margin of the anal canal measuring 2.2 x 1.9 x 1.8 cm (AP by transverse by craniocaudal). This is centered just left of midline. Mildly prominent left inguinal lymph node is most likely reactive. Visualized portions of the bowel are not obstructed. No free fluid within the pelvis. The bladder is unremarkable. Prostate is unremarkable. Pelvic vasculature is patent with mild atherosclerotic calcification. There is mild lower lumbar degenerative disc disease.   IMPRESSION: Left posterior perianal abscess. Electronically Signed by: Ozell JONETTA Cordial, MD on 10/12/2023 10:13 AM    Electronically Signed: Loralee ONEIDA Mars, PA 10/12/2023 11:44 AM      [1] Allergies Allergen Reactions   Hydrocodone-Acetaminophen  Itching

## 2023-12-16 NOTE — ED Notes (Signed)
 Wife reports that he is also having some issues with his vision and she feels that his words are slurring. Charge RN aware.

## 2023-12-16 NOTE — ED Provider Notes (Signed)
 ------------------------------------------------------------------------------- Attestation signed by Lynwood JAYSON Life, MD at 12/16/23 2250 I have reviewed and agree with the APP's findings and plan for this patient. Lynwood JAYSON Life, MD Emergency Department - 12/16/2023 10:50 PM  -------------------------------------------------------------------------------   [facility]  ED Provider Note  Jimmy Small 63 y.o. male DOB: Mar 09, 1961 MRN: [id number] History   Chief Complaint  Patient presents with   Numbness    Reports left sided leg pain and numbness from the hip to the foot. Ongoing for the last 4 days.    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  States that he has been taking Tylenol  and Motrin  with minimal relief of symptoms.  Patient has any saddle anesthesia or any bowel or bladder incontinence       Past Medical History:  Diagnosis Date   Alcohol abuse    Gallbladder disease    Neck pain     Past Surgical History:  Procedure Laterality Date   Cholecystectomy  09/2011   Tonsillectomy      Social History   Substance and Sexual Activity  Alcohol Use Yes   Alcohol/week: 64.0 standard drinks of alcohol   Types: 64 Cans of beer per week   Comment: daily   Tobacco Use History[1] E-Cigarettes   Vaping Use     Start Date     Cartridges/Day     Quit Date     Social History   Substance and Sexual Activity  Drug Use Yes   Frequency: 2.0 times per week   Types: Marijuana, Methamphetamines         Allergies[2]  Discharge Medication List as of 12/16/2023 12:57 PM      Primary Survey   Exposure No Thoracic  Spine Tenderness and No Lumbar Spinal Tenderness         Review of Systems   Review of Systems  Constitutional:  Negative for chills and fever.  HENT:  Negative for ear pain and sore throat.   Eyes:  Negative for pain and visual disturbance.  Respiratory:  Negative for cough and shortness of breath.   Cardiovascular:  Negative for chest pain and palpitations.  Gastrointestinal:  Negative for abdominal pain and vomiting.  Genitourinary:  Negative for dysuria and hematuria.  Musculoskeletal:  Positive for back pain. Negative for arthralgias.  Skin:  Negative for color change and rash.  Neurological:  Negative for seizures and syncope.  All other systems reviewed and are negative.   Physical Exam   ED Triage Vitals [12/16/23 0959]  BP 134/71  Heart Rate 106  Resp 20  SpO2 100 %  Temp 98.2 F (36.8 C)    Physical Exam  Nursing note and vitals reviewed. Constitutional: He appears well-developed and well-nourished.  HENT:  Head: Normocephalic and atraumatic.  Eyes: EOM are intact. Pupils are equal, round, and reactive to light.  Neck: Normal range of motion. Neck supple.  Cardiovascular: Normal rate, regular rhythm and normal heart sounds.  Pulmonary/Chest: Respiratory effort normal and breath sounds normal.  Abdominal: Soft. Bowel sounds are normal.  Musculoskeletal: No T spine tenderness and no L spine tenderness. Normal range of motion.  Cervical back: Normal range of motion and neck supple.     Lumbar back: Spasms and tenderness present. No bony tenderness.       Back:   Neurological: He is alert. He has normal speech.  Skin: Skin is warm. Skin is dry.  Psychiatric: He has a normal mood and affect. His behavior is normal.     ED Course   Lab results:   CBC AND DIFFERENTIAL - Abnormal      Result Value   WBC 17.6 (*)    RBC 4.59 (*)    HGB 14.9     HCT 42.5     MCV 92.6 (*)    MCH 32.5 (*)    MCHC 35.1     Plt Ct 244     RDW SD 44.5     MPV  10.6     NRBC% 0.0     Absolute NRBC Count 0.00     NEUTROPHIL % 89.9     LYMPHOCYTE % 3.3     MONOCYTE % 5.9     Eosinophil % 0.1     BASOPHIL % 0.3     IG% 0.5     ABSOLUTE NEUTROPHIL COUNT 15.80 (*)    ABSOLUTE LYMPHOCYTE COUNT 0.58 (*)    Absolute Monocyte Count 1.04 (*)    Absolute Eosinophil Count 0.01     Absolute Basophil Count 0.06     Absolute Immature Granulocyte Count 0.09 (*)   COMPREHENSIVE METABOLIC PANEL - Abnormal   Na 140     Potassium 4.3     Cl 100     CO2 22     AGAP 18 (*)    Glucose 114 (*)    BUN 9     Creatinine 0.83     Ca 9.0     ALK PHOS 80     T Bili 0.9     Total Protein 7.8     Alb 4.1     GLOBULIN 3.7     ALBUMIN/GLOBULIN RATIO 1.1     BUN/CREAT RATIO 10.8 (*)    ALT 56 (*)    AST 58 (*)    eGFR 100     Comment: Normal GFR (glomerular filtration rate) > 60 mL/min/1.73 meters squared, < 60 may include impaired kidney function. Calculation based on the Chronic Kidney Disease Epidemiology Collaboration (CK-EPI)equation refit without adjustment for race.  URINALYSIS W/MICRO REFLEX CULTURE - SYMPTOMATIC - Abnormal   Urine Color Yellow     Urine Clarity Clear     Urine Specific Gravity 1.014     Urine pH 8.5     Urine Protein - Dipstick 10 (*)    Urine Glucose Negative     Urine Ketones Negative     Urine Bilirubin Negative     Urine Blood Negative     Urine Nitrite Negative     Urine Urobilinogen <2     Urine Leukocyte Esterase Negative     UA Microscopic No Micro     Narrative:    Does not meet criteria for reflex to Urine Culture.  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 No hyperdense MCA. No evidence of  hydrocephalus. No intracranial mass, mass effect or midline shift. No acute infarction evident. No intracranial hemorrhage. Paranasal sinuses: Mild mucosal thickening/mucous. Mastoid air cells: Small bilateral mastoid effusions. Calvarium: Intact.    Impression:    IMPRESSION: No acute intracranial abnormality. Additional findings as detailed above.  Electronically Signed by: Bernard LULLA Blanch MD, MBA on 12/16/2023 11:24 AM     ECG: ECG Results   None                                                                        Pre-Sedation Procedures    Medical Decision Making Patient's vital signs are stable physical examination patient has point tenderness over the piriformis and lower back muscles.  Patient's blood work shows significant elevated white count with neutrophilic shift remaining blood work was unremarkable patient has no evidence however does have reported history of episodes of vomiting that may be contributing to elevated white count  CT head was unremarkable  Urinalysis shows no signs of infection  Discussed with patient based on clinical presentation and point tenderness likely patient experienced sciatica we will attempt to address with steroids and pain medication.  Encourage patient to follow-up with orthopedics as needed if pain does not significantly proved after steroids  My exam findings, treatment plan has been discused with the patient/ family. Patient/ family understand and agree with the findings, diagnosis, and treatment plan. will discharge at this time.  Please see the 'After Visit Summary' discharge instructions for more in-depth and detailed discharge plan.  Risk Prescription drug management.          Provider Communication  Discharge Medication List as of 12/16/2023 12:57 PM     START taking these medications   Details  ibuprofen  (ADVIL ,MOTRIN ) 800 mg tablet Take one tablet (800 mg dose) by mouth  3 (three) times a day., Starting Mon 12/16/2023, Normal    ondansetron  (ZOFRAN -ODT) 4 mg disintegrating tablet Take one tablet (4 mg dose) by mouth every 8 (eight) hours as needed for Nausea for up to 7 days., Starting Mon 12/16/2023, Until Mon 12/23/2023 at 2359, Normal    oxyCODONE -acetaminophen  (PERCOCET,ENDOCET) 5-325 mg per tablet Take one tablet by mouth every 6 (six) hours as needed for Pain for up to 4 days. Max Daily Amount: 4 tablets, Starting Mon 12/16/2023, Until Fri 12/20/2023 at 2359, Normal    predniSONE  (DELTASONE ) 10 mg tablet 4 tabs daily x2 days, 3 tabs daily x2 days, 2 tabs daily x2 days, 1 tablet daily x2 days, Normal        Discharge Medication List as of 12/16/2023 12:57 PM      Discharge Medication List as of 12/16/2023 12:57 PM      Clinical Impression Final diagnoses:  Sciatica of left side    ED Disposition     ED Disposition  Discharge   Condition  Stable   Comment  --                 Follow-up Information     [facility] [place]).   Contact information: [address] [place]  [address] [phone]  Electronically signed by:       [1] Social History Tobacco Use  Smoking Status Every Day   Current packs/day: 3.00   Types: Cigarettes  Smokeless Tobacco Never  [2] Allergies Allergen Reactions   Hydrocodone-Acetaminophen  Itching   Delon Ash, PA-C 12/16/23 2248

## 2023-12-16 NOTE — ED Notes (Signed)
 Patient laid down in the floor in the lobby saying that he felt that he needed to lie down due to the pain. Patient assisted back up to a seated position. Consulting Civil Engineer notified.

## 2024-04-04 ENCOUNTER — Emergency Department (HOSPITAL_COMMUNITY)
Admission: EM | Admit: 2024-04-04 | Discharge: 2024-04-04 | Disposition: A | Discharge status: Home / Self Care | Payer: Self-pay | Attending: Emergency Medicine | Admitting: Emergency Medicine

## 2024-04-04 ENCOUNTER — Other Ambulatory Visit: Payer: Self-pay

## 2024-04-04 ENCOUNTER — Emergency Department (HOSPITAL_COMMUNITY): Payer: Self-pay

## 2024-04-04 ENCOUNTER — Encounter (HOSPITAL_COMMUNITY): Payer: Self-pay

## 2024-04-04 DIAGNOSIS — M5442 Lumbago with sciatica, left side: Secondary | ICD-10-CM | POA: Insufficient documentation

## 2024-04-04 LAB — CBC WITH DIFFERENTIAL/PLATELET
Abs Immature Granulocytes: 0.02 K/uL (ref 0.00–0.07)
Basophils Absolute: 0 K/uL (ref 0.0–0.1)
Basophils Relative: 0 %
Eosinophils Absolute: 0.1 K/uL (ref 0.0–0.5)
Eosinophils Relative: 2 %
HCT: 43.5 % (ref 39.0–52.0)
Hemoglobin: 15.2 g/dL (ref 13.0–17.0)
Immature Granulocytes: 0 %
Lymphocytes Relative: 18 %
Lymphs Abs: 1.6 K/uL (ref 0.7–4.0)
MCH: 32.4 pg (ref 26.0–34.0)
MCHC: 34.9 g/dL (ref 30.0–36.0)
MCV: 92.8 fL (ref 80.0–100.0)
Monocytes Absolute: 0.9 K/uL (ref 0.1–1.0)
Monocytes Relative: 10 %
Neutro Abs: 6.5 K/uL (ref 1.7–7.7)
Neutrophils Relative %: 70 %
Platelets: 275 K/uL (ref 150–400)
RBC: 4.69 MIL/uL (ref 4.22–5.81)
RDW: 12.6 % (ref 11.5–15.5)
WBC: 9.3 K/uL (ref 4.0–10.5)
nRBC: 0 % (ref 0.0–0.2)

## 2024-04-04 LAB — URINALYSIS, ROUTINE W REFLEX MICROSCOPIC
Bilirubin Urine: NEGATIVE
Glucose, UA: NEGATIVE mg/dL
Hgb urine dipstick: NEGATIVE
Ketones, ur: NEGATIVE mg/dL
Leukocytes,Ua: NEGATIVE
Nitrite: NEGATIVE
Protein, ur: NEGATIVE mg/dL
Specific Gravity, Urine: 1.01 (ref 1.005–1.030)
pH: 6 (ref 5.0–8.0)

## 2024-04-04 LAB — BASIC METABOLIC PANEL WITH GFR
Anion gap: 13 (ref 5–15)
BUN: 10 mg/dL (ref 8–23)
CO2: 23 mmol/L (ref 22–32)
Calcium: 9.6 mg/dL (ref 8.9–10.3)
Chloride: 103 mmol/L (ref 98–111)
Creatinine, Ser: 0.82 mg/dL (ref 0.61–1.24)
GFR, Estimated: >60 mL/min
Glucose, Bld: 107 mg/dL — ABNORMAL HIGH (ref 70–99)
Potassium: 4.2 mmol/L (ref 3.5–5.1)
Sodium: 139 mmol/L (ref 135–145)

## 2024-04-04 MED ORDER — LORAZEPAM 2 MG/ML IJ SOLN
1.0000 mg | Freq: Once | INTRAMUSCULAR | Status: AC
  Administered 2024-04-04: 1 mg via INTRAVENOUS
  Filled 2024-04-04: qty 1

## 2024-04-04 MED ORDER — HYDROMORPHONE HCL 1 MG/ML IJ SOLN
1.0000 mg | Freq: Once | INTRAMUSCULAR | Status: AC
  Administered 2024-04-04: 1 mg via INTRAVENOUS
  Filled 2024-04-04: qty 1

## 2024-04-04 MED ORDER — OXYCODONE-ACETAMINOPHEN 5-325 MG PO TABS
1.0000 | ORAL_TABLET | Freq: Once | ORAL | Status: AC
  Administered 2024-04-04: 1 via ORAL
  Filled 2024-04-04: qty 1

## 2024-04-04 MED ORDER — MORPHINE SULFATE (PF) 4 MG/ML IV SOLN
8.0000 mg | Freq: Once | INTRAVENOUS | Status: AC
  Administered 2024-04-04: 8 mg via INTRAVENOUS
  Filled 2024-04-04: qty 2

## 2024-04-04 MED ORDER — HYDROMORPHONE HCL 1 MG/ML IJ SOLN
0.5000 mg | Freq: Once | INTRAMUSCULAR | Status: AC
  Administered 2024-04-04: 0.5 mg via INTRAVENOUS
  Filled 2024-04-04: qty 1

## 2024-04-04 MED ORDER — ONDANSETRON HCL 4 MG/2ML IJ SOLN
4.0000 mg | Freq: Once | INTRAMUSCULAR | Status: AC
  Administered 2024-04-04: 4 mg via INTRAVENOUS
  Filled 2024-04-04: qty 2

## 2024-04-04 MED ORDER — NALOXONE HCL 0.4 MG/ML IJ SOLN
0.4000 mg | Freq: Once | INTRAMUSCULAR | Status: AC
  Administered 2024-04-04: 0.4 mg via INTRAVENOUS
  Filled 2024-04-04: qty 1

## 2024-04-04 MED ORDER — ZIPRASIDONE MESYLATE 20 MG IM SOLR
10.0000 mg | Freq: Once | INTRAMUSCULAR | Status: AC
  Administered 2024-04-04: 10 mg via INTRAMUSCULAR
  Filled 2024-04-04: qty 20

## 2024-04-04 MED ORDER — LORAZEPAM 2 MG/ML IJ SOLN: 1.0000 mg | Freq: Once | INTRAMUSCULAR | Status: DC

## 2024-04-04 MED ORDER — OXYCODONE HCL 5 MG PO CAPS: 5.0000 mg | ORAL_CAPSULE | Freq: Four times a day (QID) | ORAL | 0 refills | Status: AC | PRN

## 2024-04-04 MED ORDER — PREDNISONE 20 MG PO TABS: 40.0000 mg | ORAL_TABLET | Freq: Every day | ORAL | 0 refills | Status: AC

## 2024-04-04 MED ORDER — LORAZEPAM 2 MG/ML IJ SOLN
0.5000 mg | Freq: Once | INTRAMUSCULAR | Status: AC | PRN
  Administered 2024-04-04: 0.5 mg via INTRAVENOUS
  Filled 2024-04-04: qty 1

## 2024-04-04 NOTE — ED Provider Notes (Signed)
 Patient signed out to myself from Warren Shad, PA-C pending medication metabolism and discharge.  In short patient presented today for left-sided low back pain with radiation down his leg.  Patient's workup is consistent with sciatica.  Patient alert and ambulatory without assistance. Today for mission further workup however patient's vital signs, physical exam, labs, and imaging are reassuring.  Patient given outpatient course of steroids and advised to alternate Tylenol /Motrin .  Patient to follow-up with Dr. Colon with neurosurgery if symptoms persist for further evaluation and workup.  I feel patient is safe for discharge at this time.     Francis Ileana SAILOR, PA-C 04/04/24 1656    Francesca Elsie CROME, MD 04/04/24 [phone]

## 2024-04-04 NOTE — ED Notes (Signed)
Pt given cranberry juice and turkey sandwich 

## 2024-04-04 NOTE — ED Provider Notes (Signed)
 " [place] EMERGENCY DEPARTMENT AT [facility] Provider Note   CSN: [id number] Arrival date & time: 04/04/24  0150     Patient presents with: Hip Pain   Jimmy Small is a 63 y.o. male patient with chronic back pain reporting to ER with complaint of back pain. Patient reports that he started having left sided low back pain that radiates into his hip and down his leg. Reports pain is 10/10, worse pain of his life. He reports numbness sensation of great toe. Denies tingling or weakness. Reports he has had some change of urine function in the last 3 months, but specifically since this started 3 days ago he will just randomly having small episodes of urinary incontinence. NO change in bowel function. Denies no anesthesia but does support feeling like his pain is radiating around to the left inguinal area.  No history of IV drug use or cancer.  No fever. Does manual labor for work. No trauma. Can't sleep.     Hip Pain       Prior to Admission medications  Medication Sig Start Date End Date Taking? Authorizing Provider  ibuprofen  (ADVIL ) 800 MG tablet Take 1 tablet (800 mg total) by mouth every 8 (eight) hours as needed for moderate pain. 10/24/20   Towana Ozell BROCKS, MD  methocarbamol  (ROBAXIN ) 500 MG tablet Take 1 tablet (500 mg total) by mouth 2 (two) times daily as needed for muscle spasms. 10/24/20   Towana Ozell BROCKS, MD  omeprazole  (PRILOSEC) 20 MG capsule Take 1 capsule (20 mg total) by mouth daily. Patient not taking: Reported on 01/20/2019 08/19/11 08/18/12  Schinlever, Dorothyann, PA-C  ondansetron  (ZOFRAN ) 4 MG tablet Take 1 tablet (4 mg total) by mouth every 6 (six) hours. 11/26/22   Neysa Caron PARAS, DO    Allergies: Vicodin [hydrocodone-acetaminophen ]    Review of Systems  Musculoskeletal:  Positive for arthralgias and back pain.    Updated Vital Signs BP (!) 158/123 (BP Location: Left Arm)   Pulse 96   Temp 97.6 F (36.4 C) (Oral)   Resp 20   Ht 5' 10.5 (1.791 m)    Wt 68 kg   SpO2 100%   BMI 21.22 kg/m   Physical Exam Vitals and nursing note reviewed.  Constitutional:      General: He is not in acute distress.    Appearance: He is not toxic-appearing.  HENT:     Head: Normocephalic and atraumatic.  Eyes:     General: No scleral icterus.    Conjunctiva/sclera: Conjunctivae normal.  Cardiovascular:     Rate and Rhythm: Normal rate and regular rhythm.     Pulses: Normal pulses.     Heart sounds: Normal heart sounds.  Pulmonary:     Effort: Pulmonary effort is normal. No respiratory distress.     Breath sounds: Normal breath sounds.  Abdominal:     General: Abdomen is flat. Bowel sounds are normal.     Palpations: Abdomen is soft.     Tenderness: There is no abdominal tenderness.  Musculoskeletal:       Arms:     Comments: Has TTP over lumbar spine just lateral of midline on the left side. TTP over left gluteal muscle.  5 out of 5 strength of lower extremity. Decreased sensation over lateral aspect of upper leg, lateral left shin and into top of foot. Strong dorsal pedal pulse bilaterally, good cap refill.  No rash over area of pain.   Skin:  General: Skin is warm and dry.     Findings: No lesion.  Neurological:     General: No focal deficit present.     Mental Status: He is alert and oriented to person, place, and time. Mental status is at baseline.     Comments: Patient is able to move around room and sit up and walk but not lay down flat.     (all labs ordered are listed, but only abnormal results are displayed) Labs Reviewed - No data to display  EKG: None  Radiology: DG Hip Unilat W or Wo Pelvis 2-3 Views Left Result Date: 04/04/2024 CLINICAL DATA:  Left hip pain for several days, no known injury, initial encounter EXAM: DG HIP (WITH OR WITHOUT PELVIS) 3V LEFT COMPARISON:  None Available. FINDINGS: Pelvic ring is intact. No acute fracture or dislocation is noted. No soft tissue changes are seen. IMPRESSION: No acute  abnormality noted. Electronically Signed   By: Oneil Devonshire M.D.   On: 04/04/2024 03:49     Procedures   Medications Ordered in the ED  LORazepam  (ATIVAN ) injection 0.5 mg (has no administration in time range)  oxyCODONE -acetaminophen  (PERCOCET/ROXICET) 5-325 MG per tablet 1 tablet (1 tablet Oral Given 04/04/24 0310)  HYDROmorphone  (DILAUDID ) injection 0.5 mg (0.5 mg Intravenous Given 04/04/24 0833)  ondansetron  (ZOFRAN ) injection 4 mg (4 mg Intravenous Given 04/04/24 0832)    Clinical Course as of 04/04/24 1054  Sat Apr 04, 2024  0906 Reassessed and still appearing very uncomfortable rating pain 7/10, sitting uncomfortably in bed. Given 2nd dose of pain medication.  [JB]    Clinical Course User Index [JB] Christohper Dube, Warren SAILOR, PA-C                                 Medical Decision Making Amount and/or Complexity of Data Reviewed Labs: ordered. Radiology: ordered.  Risk Prescription drug management.   This patient presents to the ED for concern of low back pain, this involves an extensive number of treatment options, and is a complaint that carries with it a high risk of complications and morbidity.  The differential diagnosis includes MSK in nature, fracture, epidural hematoma/abscess, cauda equina syndrome, spinal stenosis, spinal malignancy, discitis, spinal infection, spondylitises/ spondylosis, conus medullaris, DDD of the back.  Lab Tests:  I personally interpreted labs.  The pertinent results include:   CBC, BMP, UA   Imaging Studies ordered:  I ordered imaging studies including x-ray of left hip which showed no acute findings, MRI of lumbar spine: I independently visualized and interpreted imaging which showed significant central canal stenosis or cauda equina.  Moderate L3-L4 and mild to moderate at L4-L5 with small annular tear and left 4 disc protrusion. I agree with the radiologist interpretation   Cardiac Monitoring: / EKG:  The patient was maintained on a  cardiac monitor.     Problem List / ED Course / Critical interventions / Medication management  Reports to emergency room with complaint of low back pain that started 3 days ago.  On arrival he reports very severe pain on the left side of low back and down the left side of hip into leg. Vitals are stable.  Does have some decrease sensation over the top of his foot.  He is strong dorsal pedal pulse and good cap refill in the leg.  No obvious swelling to the leg.  Given severity of symptoms and general urinary issues we will proceed  with getting MRI imaging of the lumbar spine. Basic labs are okay here.  I ordered medication including Hydromorphone , Zofran  for pain control.  Reevaluation of the patient after these medicines showed that the patient improved Patient was having difficulty laying down and thus cannot tolerate MRI after multiple rounds of pain medicine.  Will trial Geodon  for anxiety/ agitation prior to MRI. Discussed with attending who agrees to plan.  I have reviewed the patients home medicines and have made adjustments as needed Needs observation to metabolization and then anticipate discharge home. Signed off to [facility] at shift change.         Final diagnoses:  Acute left-sided low back pain with left-sided sciatica    ED Discharge Orders          Ordered    predniSONE  (DELTASONE ) 20 MG tablet  Daily        04/04/24 1430    oxycodone  (OXY-IR) 5 MG capsule  Every 6 hours PRN        04/04/24 1430               Ansar Skoda, Warren SAILOR, PA-C 04/04/24 1506  "

## 2024-04-04 NOTE — Discharge Instructions (Addendum)
 1000 mg of Tylenol  every 12 hours.  You can take steroids once daily.   Once steroids are done you can take ibuprofen  800 mg every 8 hours but make sure you take this medicine with food.  Take oxycodone  for breakthrough pain.  Use ice and gentle stretching.  Follow-up with neurosurgery and return to ER with new or worsening symptoms.

## 2024-04-04 NOTE — ED Triage Notes (Signed)
 Arrives [facility] from home with left sided hip pain / sciatica for 3 days.   Says he had a similar presentation a month ago.   Denies any injury. Appears to be in great pain.

## 2024-04-04 NOTE — ED Notes (Signed)
 Pt asking about pain meds. I explained to pt  process and that we have given him what we were able to without a provider seeing him. Pt very unhappy with this answer.
